# Patient Record
Sex: Female | Born: 1965 | Race: Black or African American | Hispanic: No | Marital: Single | Smoking: Never smoker
Health system: Southern US, Community
[De-identification: ages and names within clinical notes are randomized; demographics above are authoritative.]

## PROBLEM LIST (undated history)

## (undated) DIAGNOSIS — E119 Type 2 diabetes mellitus without complications: Secondary | ICD-10-CM

## (undated) DIAGNOSIS — F329 Major depressive disorder, single episode, unspecified: Secondary | ICD-10-CM

## (undated) DIAGNOSIS — B009 Herpesviral infection, unspecified: Secondary | ICD-10-CM

## (undated) DIAGNOSIS — F32A Depression, unspecified: Secondary | ICD-10-CM

## (undated) DIAGNOSIS — G4730 Sleep apnea, unspecified: Secondary | ICD-10-CM

## (undated) DIAGNOSIS — M62838 Other muscle spasm: Secondary | ICD-10-CM

## (undated) DIAGNOSIS — R51 Headache: Secondary | ICD-10-CM

## (undated) DIAGNOSIS — F1910 Other psychoactive substance abuse, uncomplicated: Secondary | ICD-10-CM

## (undated) DIAGNOSIS — N83209 Unspecified ovarian cyst, unspecified side: Secondary | ICD-10-CM

## (undated) HISTORY — PX: TUBAL LIGATION: SHX77

## (undated) HISTORY — DX: Herpesviral infection, unspecified: B00.9

---

## 2000-03-26 DIAGNOSIS — F1910 Other psychoactive substance abuse, uncomplicated: Secondary | ICD-10-CM

## 2000-03-26 HISTORY — DX: Other psychoactive substance abuse, uncomplicated: F19.10

## 2009-01-02 ENCOUNTER — Emergency Department (HOSPITAL_COMMUNITY): Admission: EM | Admit: 2009-01-02 | Discharge: 2009-01-02 | Payer: Self-pay | Admitting: Emergency Medicine

## 2009-07-13 ENCOUNTER — Emergency Department (HOSPITAL_COMMUNITY): Admission: EM | Admit: 2009-07-13 | Discharge: 2009-07-13 | Payer: Self-pay | Admitting: Emergency Medicine

## 2010-11-04 ENCOUNTER — Emergency Department (HOSPITAL_COMMUNITY): Payer: Medicare Other

## 2010-11-04 ENCOUNTER — Emergency Department (HOSPITAL_COMMUNITY)
Admission: EM | Admit: 2010-11-04 | Discharge: 2010-11-05 | Disposition: A | Discharge status: Home / Self Care | Payer: Medicare Other | Attending: Emergency Medicine | Admitting: Emergency Medicine

## 2010-11-04 DIAGNOSIS — R079 Chest pain, unspecified: Secondary | ICD-10-CM | POA: Insufficient documentation

## 2010-11-04 DIAGNOSIS — F329 Major depressive disorder, single episode, unspecified: Secondary | ICD-10-CM | POA: Insufficient documentation

## 2010-11-04 DIAGNOSIS — K219 Gastro-esophageal reflux disease without esophagitis: Secondary | ICD-10-CM | POA: Insufficient documentation

## 2010-11-04 DIAGNOSIS — M546 Pain in thoracic spine: Secondary | ICD-10-CM | POA: Insufficient documentation

## 2010-11-04 DIAGNOSIS — F3289 Other specified depressive episodes: Secondary | ICD-10-CM | POA: Insufficient documentation

## 2010-11-04 DIAGNOSIS — Z79899 Other long term (current) drug therapy: Secondary | ICD-10-CM | POA: Insufficient documentation

## 2010-11-04 LAB — DIFFERENTIAL
Basophils Absolute: 0 10*3/uL (ref 0.0–0.1)
Basophils Relative: 0 % (ref 0–1)
Eosinophils Absolute: 0.4 10*3/uL (ref 0.0–0.7)
Lymphocytes Relative: 34 % (ref 12–46)
Monocytes Absolute: 0.3 10*3/uL (ref 0.1–1.0)
Neutro Abs: 4.4 10*3/uL (ref 1.7–7.7)

## 2010-11-04 LAB — CBC
MCHC: 32.2 g/dL (ref 30.0–36.0)
Platelets: 217 10*3/uL (ref 150–400)
RBC: 3.78 MIL/uL — ABNORMAL LOW (ref 3.87–5.11)
RDW: 13.7 % (ref 11.5–15.5)
WBC: 7.8 10*3/uL (ref 4.0–10.5)

## 2010-11-04 LAB — BASIC METABOLIC PANEL
BUN: 11 mg/dL (ref 6–23)
Calcium: 8.7 mg/dL (ref 8.4–10.5)
Chloride: 101 mEq/L (ref 96–112)
Creatinine, Ser: 0.72 mg/dL (ref 0.50–1.10)
GFR calc Af Amer: >60 mL/min (ref >60)
GFR calc non Af Amer: >60 mL/min (ref >60)
Glucose, Bld: 87 mg/dL (ref 70–99)

## 2010-11-04 LAB — CK TOTAL AND CKMB (NOT AT ARMC)
CK, MB: 2.8 ng/mL (ref 0.3–4.0)
Relative Index: 1.5 (ref 0.0–2.5)
Total CK: 184 U/L — ABNORMAL HIGH (ref 7–177)

## 2010-11-04 MED ORDER — IOHEXOL 300 MG/ML  SOLN
100.0000 mL | Freq: Once | INTRAMUSCULAR | Status: AC | PRN
  Administered 2010-11-04: 100 mL via INTRAVENOUS

## 2010-11-29 ENCOUNTER — Emergency Department (HOSPITAL_COMMUNITY)
Admission: EM | Admit: 2010-11-29 | Discharge: 2010-11-29 | Disposition: A | Discharge status: Home / Self Care | Payer: Medicare Other | Attending: Emergency Medicine | Admitting: Emergency Medicine

## 2010-11-29 ENCOUNTER — Emergency Department (HOSPITAL_COMMUNITY): Payer: Medicare Other

## 2010-11-29 DIAGNOSIS — F329 Major depressive disorder, single episode, unspecified: Secondary | ICD-10-CM | POA: Insufficient documentation

## 2010-11-29 DIAGNOSIS — R0789 Other chest pain: Secondary | ICD-10-CM | POA: Insufficient documentation

## 2010-11-29 DIAGNOSIS — F3289 Other specified depressive episodes: Secondary | ICD-10-CM | POA: Insufficient documentation

## 2010-11-29 DIAGNOSIS — K219 Gastro-esophageal reflux disease without esophagitis: Secondary | ICD-10-CM | POA: Insufficient documentation

## 2010-11-29 DIAGNOSIS — R109 Unspecified abdominal pain: Secondary | ICD-10-CM | POA: Insufficient documentation

## 2010-11-29 DIAGNOSIS — R110 Nausea: Secondary | ICD-10-CM | POA: Insufficient documentation

## 2010-11-29 DIAGNOSIS — Z79899 Other long term (current) drug therapy: Secondary | ICD-10-CM | POA: Insufficient documentation

## 2010-11-29 LAB — COMPREHENSIVE METABOLIC PANEL
Albumin: 3.6 g/dL (ref 3.5–5.2)
BUN: 18 mg/dL (ref 6–23)
CO2: 28 mEq/L (ref 19–32)
Chloride: 101 mEq/L (ref 96–112)
Total Bilirubin: 0.7 mg/dL (ref 0.3–1.2)
Total Protein: 7.1 g/dL (ref 6.0–8.3)

## 2010-11-29 LAB — URINALYSIS, ROUTINE W REFLEX MICROSCOPIC
Glucose, UA: NEGATIVE mg/dL
Specific Gravity, Urine: 1.029 (ref 1.005–1.030)

## 2010-11-29 LAB — CBC
MCH: 29.2 pg (ref 26.0–34.0)
RDW: 13 % (ref 11.5–15.5)

## 2010-11-29 LAB — POCT I-STAT TROPONIN I: Troponin i, poc: 0 ng/mL (ref 0.00–0.08)

## 2010-11-29 LAB — POCT PREGNANCY, URINE: Preg Test, Ur: NEGATIVE

## 2010-11-29 LAB — LIPASE, BLOOD: Lipase: 20 U/L (ref 11–59)

## 2010-12-27 ENCOUNTER — Emergency Department (HOSPITAL_COMMUNITY): Payer: Medicare Other

## 2010-12-27 ENCOUNTER — Emergency Department (HOSPITAL_COMMUNITY)
Admission: EM | Admit: 2010-12-27 | Discharge: 2010-12-28 | Disposition: A | Discharge status: Home / Self Care | Payer: Medicare Other | Attending: Emergency Medicine | Admitting: Emergency Medicine

## 2010-12-27 DIAGNOSIS — K219 Gastro-esophageal reflux disease without esophagitis: Secondary | ICD-10-CM | POA: Insufficient documentation

## 2010-12-27 DIAGNOSIS — J45909 Unspecified asthma, uncomplicated: Secondary | ICD-10-CM | POA: Insufficient documentation

## 2010-12-27 DIAGNOSIS — M719 Bursopathy, unspecified: Secondary | ICD-10-CM | POA: Insufficient documentation

## 2010-12-27 DIAGNOSIS — G4730 Sleep apnea, unspecified: Secondary | ICD-10-CM | POA: Insufficient documentation

## 2010-12-27 DIAGNOSIS — M67919 Unspecified disorder of synovium and tendon, unspecified shoulder: Secondary | ICD-10-CM | POA: Insufficient documentation

## 2010-12-27 DIAGNOSIS — M25519 Pain in unspecified shoulder: Secondary | ICD-10-CM | POA: Insufficient documentation

## 2010-12-27 DIAGNOSIS — R0789 Other chest pain: Secondary | ICD-10-CM | POA: Insufficient documentation

## 2010-12-27 DIAGNOSIS — M79609 Pain in unspecified limb: Secondary | ICD-10-CM | POA: Insufficient documentation

## 2011-03-21 ENCOUNTER — Emergency Department (HOSPITAL_COMMUNITY)
Admission: EM | Admit: 2011-03-21 | Discharge: 2011-03-22 | Disposition: A | Discharge status: Home / Self Care | Payer: Medicare Other | Attending: Emergency Medicine | Admitting: Emergency Medicine

## 2011-03-21 ENCOUNTER — Encounter: Payer: Self-pay | Admitting: Emergency Medicine

## 2011-03-21 DIAGNOSIS — G4730 Sleep apnea, unspecified: Secondary | ICD-10-CM | POA: Insufficient documentation

## 2011-03-21 DIAGNOSIS — B86 Scabies: Secondary | ICD-10-CM | POA: Insufficient documentation

## 2011-03-21 DIAGNOSIS — L299 Pruritus, unspecified: Secondary | ICD-10-CM | POA: Insufficient documentation

## 2011-03-21 DIAGNOSIS — R21 Rash and other nonspecific skin eruption: Secondary | ICD-10-CM | POA: Insufficient documentation

## 2011-03-21 DIAGNOSIS — J45909 Unspecified asthma, uncomplicated: Secondary | ICD-10-CM | POA: Insufficient documentation

## 2011-03-21 HISTORY — DX: Sleep apnea, unspecified: G47.30

## 2011-03-21 NOTE — ED Notes (Signed)
Pt. Reports generalized itchy body rashes for 5 days .

## 2011-03-22 MED ORDER — DIPHENHYDRAMINE HCL 50 MG/ML IJ SOLN
50.0000 mg | Freq: Once | INTRAMUSCULAR | Status: AC
  Administered 2011-03-22: 50 mg via INTRAVENOUS
  Filled 2011-03-22: qty 1

## 2011-03-22 MED ORDER — PERMETHRIN 5 % EX CREA: TOPICAL_CREAM | CUTANEOUS | Status: AC

## 2011-03-22 NOTE — ED Provider Notes (Signed)
History     CSN: [id number]  Arrival date & time 03/21/11  2257   None     No chief complaint on file.   ) Patient is a 45 y.o. female presenting with rash.  Rash  This is a new problem. The current episode started more than 2 days ago. The problem has been gradually worsening. The problem is associated with nothing. There has been no fever. The rash is present on the torso, back, right lower leg, right upper leg, left lower leg, left upper leg, left arm, right arm, right hand and left hand. The pain is at a severity of 0/10. The patient is experiencing no pain. Associated symptoms include itching. Pertinent negatives include no blisters and no weeping. She has tried nothing for the symptoms.  Reports onset of itchy rash approximately one week ago. Denies exposure to anyone with rash.   Past Medical History  Diagnosis Date  . Asthma   . Sleep apnea     History reviewed. No pertinent past surgical history.  No family history on file.  History  Substance Use Topics  . Smoking status: Never Smoker   . Smokeless tobacco: Not on file  . Alcohol Use: No    OB History    Grav Para Term Preterm Abortions TAB SAB Ect Mult Living                  Review of Systems  Constitutional: Negative.   HENT: Negative.   Eyes: Negative.   Respiratory: Negative.   Cardiovascular: Negative.   Gastrointestinal: Negative.   Genitourinary: Negative.   Musculoskeletal: Negative.   Skin: Positive for itching and rash.  Neurological: Negative.   Hematological: Negative.   Psychiatric/Behavioral: Negative.     Allergies  Review of patient's allergies indicates no known allergies.  Home Medications  No current outpatient prescriptions on file.  BP 148/76  Pulse 90  Temp(Src) 97.8 F (36.6 C) (Oral)  Resp 16  SpO2 97%  LMP 02/22/2011  Physical Exam  Constitutional: She is oriented to person, place, and time. She appears well-developed and well-nourished.  HENT:  Head:  Normocephalic and atraumatic.  Eyes: Conjunctivae are normal.  Neck: Neck supple.  Cardiovascular: Normal rate.   Pulmonary/Chest: Effort normal.  Musculoskeletal: Normal range of motion.  Neurological: She is alert and oriented to person, place, and time.  Skin: Skin is warm and dry. No erythema.       Raised erythematous lesions noted to back, BUE's , abd and BLE's.   Psychiatric: She has a normal mood and affect.    ED Course  Procedures findings and impression discussed with patient. Will discharge home with treatment for scabies.  Labs Reviewed - No data to display No results found.   No diagnosis found.    MDM  HPI/PE c/w scabies        Leanne Chang, NP 03/22/11 [phone]

## 2011-03-22 NOTE — ED Provider Notes (Signed)
Medical screening examination/treatment/procedure(s) were performed by non-physician practitioner and as supervising physician I was immediately available for consultation/collaboration.   Hanley Seamen, MD 03/22/11 [phone]

## 2011-04-09 ENCOUNTER — Inpatient Hospital Stay (HOSPITAL_COMMUNITY)
Admission: AD | Admit: 2011-04-09 | Discharge: 2011-04-09 | Disposition: A | Discharge status: Home / Self Care | Payer: Medicare Other | Source: Ambulatory Visit | Attending: Obstetrics & Gynecology | Admitting: Obstetrics & Gynecology

## 2011-04-09 ENCOUNTER — Inpatient Hospital Stay (HOSPITAL_COMMUNITY): Payer: Medicare Other

## 2011-04-09 ENCOUNTER — Encounter (HOSPITAL_COMMUNITY): Payer: Self-pay | Admitting: *Deleted

## 2011-04-09 DIAGNOSIS — N920 Excessive and frequent menstruation with regular cycle: Secondary | ICD-10-CM | POA: Insufficient documentation

## 2011-04-09 HISTORY — DX: Depression, unspecified: F32.A

## 2011-04-09 HISTORY — DX: Major depressive disorder, single episode, unspecified: F32.9

## 2011-04-09 HISTORY — DX: Headache: R51

## 2011-04-09 HISTORY — DX: Other psychoactive substance abuse, uncomplicated: F19.10

## 2011-04-09 LAB — CBC
HCT: 31.5 % — ABNORMAL LOW (ref 36.0–46.0)
Hemoglobin: 10.1 g/dL — ABNORMAL LOW (ref 12.0–15.0)
MCHC: 32.1 g/dL (ref 30.0–36.0)
WBC: 7.9 10*3/uL (ref 4.0–10.5)

## 2011-04-09 LAB — URINALYSIS, ROUTINE W REFLEX MICROSCOPIC
Bilirubin Urine: NEGATIVE
Ketones, ur: NEGATIVE mg/dL
Specific Gravity, Urine: 1.015 (ref 1.005–1.030)
Urobilinogen, UA: 0.2 mg/dL (ref 0.0–1.0)
pH: 5.5 (ref 5.0–8.0)

## 2011-04-09 LAB — URINE MICROSCOPIC-ADD ON

## 2011-04-09 LAB — POCT PREGNANCY, URINE: Preg Test, Ur: NEGATIVE

## 2011-04-09 LAB — WET PREP, GENITAL
Clue Cells Wet Prep HPF POC: NONE SEEN
Yeast Wet Prep HPF POC: NONE SEEN

## 2011-04-09 MED ORDER — MEDROXYPROGESTERONE ACETATE 10 MG PO TABS: 10.0000 mg | ORAL_TABLET | Freq: Every day | ORAL | Status: DC

## 2011-04-09 NOTE — Progress Notes (Addendum)
Bleeding started on THurs, been heavy - gushing and clots. Feeling weak.  Tubes tied 2002, at times just since then just spots with period, never had heavy bleeding like this. No pain , just feel weak.

## 2011-04-09 NOTE — ED Provider Notes (Signed)
History   Patient is a 46 year old [id number] who presents with chief complaint of heavy vaginal bleeding and clots for 5 days.  Prior to the bleeding she reports that she had been spotting, but reports a constant, heavy blood flow for the past 5 days.  She reports headaches and fatigue for the past 5 days.  Patient denies fever, chills, chest pain, shortness of breath, leg cramps, abdominal pain, dysuria, new sexual partners, and vaginal discharge.  She states her last pap smear was in 2012 and is not aware of any previous abnormal pap smears.  She denies smoking and alcohol use.    Chief Complaint  Patient presents with  . Menorrhagia   HPI   Pertinent Gynecological History: Menses: irregular occurring approximately every 60 days with spotting approximately 4-5 days per month Bleeding: intermenstrual bleeding Contraception: tubal ligation DES exposure: unknown Blood transfusions: none Sexually transmitted diseases: past history: HSV Previous GYN Procedures: N/A  Last pap: normal Date: 2012   Past Medical History  Diagnosis Date  . Asthma   . Sleep apnea   . Headache   . Drug abuse 2002  . Depression     on meds    Past Surgical History  Procedure Date  . Cesarean section   . Tubal ligation     Family History  Problem Relation Age of Onset  . Anesthesia problems Neg Hx   . Diabetes Mother   . Alcohol abuse Father   . Liver disease Father     History  Substance Use Topics  . Smoking status: Never Smoker   . Smokeless tobacco: Never Used  . Alcohol Use: No    Allergies: No Known Allergies  Prescriptions prior to admission  Medication Sig Dispense Refill  . citalopram (CELEXA) 20 MG tablet Take 20 mg by mouth daily.      . citalopram (CELEXA) 40 MG tablet Take 40 mg by mouth daily.      . IRON PO Take 1 tablet by mouth daily.      . traZODone (DESYREL) 150 MG tablet Take 150 mg by mouth at bedtime.      . valACYclovir (VALTREX) 1000 MG tablet Take 1,000 mg by  mouth daily.        Review of Systems  Constitutional: Positive for malaise/fatigue. Negative for fever and chills.  Eyes: Negative for blurred vision.  Respiratory: Negative for shortness of breath.   Cardiovascular: Negative for chest pain and leg swelling.  Gastrointestinal: Negative for nausea, vomiting, abdominal pain, diarrhea and blood in stool.  Genitourinary: Positive for hematuria. Negative for dysuria.  Neurological: Positive for headaches. Negative for loss of consciousness.   Physical Exam   Blood pressure 124/75, pulse 65, temperature 98.6 F (37 C), temperature source Oral, resp. rate 20, height 5\' 1"  (1.549 m), weight 100.699 kg (222 lb), last menstrual period 04/05/2011, SpO2 99.00%.  Physical Exam  Constitutional: She is oriented to person, place, and time. She appears well-developed and well-nourished.  HENT:  Head: Normocephalic.  Cardiovascular: Normal rate, regular rhythm and normal heart sounds.  Exam reveals no gallop and no friction rub.   No murmur heard. Respiratory: Effort normal and breath sounds normal. No respiratory distress. She has no wheezes. She has no rales.  GI: Soft. Bowel sounds are normal. There is tenderness (left lower quadrant).  Neurological: She is alert and oriented to person, place, and time.  Skin: Skin is warm and dry.  Psychiatric: She has a normal mood and affect.  MAU Course  Procedures  CBC Urine HCG Transvaginal ultrasound   Assessment and Plan   I sign out this patient to Sharen Counter, CNM, at 5:00pm, 04/09/2011. [facility] PA student  LEFTWICH-KIRBY, LISA 04/09/2011, 5:32 PM

## 2011-04-09 NOTE — Progress Notes (Signed)
Pt states menses began last Thursday, has had many blood clots, changing pad q1hour, starting to feel tired, feels weak also. Never had any issues with heavy bleeding before. No vaginal d/c change besides blood.

## 2011-04-10 LAB — GC/CHLAMYDIA PROBE AMP, GENITAL
Chlamydia, DNA Probe: NEGATIVE
GC Probe Amp, Genital: NEGATIVE

## 2011-04-17 ENCOUNTER — Encounter (HOSPITAL_COMMUNITY): Payer: Self-pay | Admitting: *Deleted

## 2011-04-17 ENCOUNTER — Inpatient Hospital Stay (HOSPITAL_COMMUNITY)
Admission: AD | Admit: 2011-04-17 | Discharge: 2011-04-17 | Disposition: A | Discharge status: Home / Self Care | Payer: Medicare Other | Source: Ambulatory Visit | Attending: Obstetrics & Gynecology | Admitting: Obstetrics & Gynecology

## 2011-04-17 DIAGNOSIS — N938 Other specified abnormal uterine and vaginal bleeding: Secondary | ICD-10-CM | POA: Insufficient documentation

## 2011-04-17 DIAGNOSIS — N949 Unspecified condition associated with female genital organs and menstrual cycle: Secondary | ICD-10-CM | POA: Insufficient documentation

## 2011-04-17 DIAGNOSIS — N921 Excessive and frequent menstruation with irregular cycle: Secondary | ICD-10-CM

## 2011-04-17 DIAGNOSIS — N920 Excessive and frequent menstruation with regular cycle: Secondary | ICD-10-CM

## 2011-04-17 LAB — POCT PREGNANCY, URINE: Preg Test, Ur: NEGATIVE

## 2011-04-17 LAB — URINALYSIS, ROUTINE W REFLEX MICROSCOPIC
Leukocytes, UA: NEGATIVE
Protein, ur: NEGATIVE mg/dL
Specific Gravity, Urine: 1.025 (ref 1.005–1.030)
Urobilinogen, UA: 0.2 mg/dL (ref 0.0–1.0)

## 2011-04-17 LAB — CBC
MCH: 28.9 pg (ref 26.0–34.0)
MCHC: 31.5 g/dL (ref 30.0–36.0)
MCV: 91.8 fL (ref 78.0–100.0)
Platelets: 218 10*3/uL (ref 150–400)
RDW: 15 % (ref 11.5–15.5)

## 2011-04-17 MED ORDER — MEDROXYPROGESTERONE ACETATE 150 MG/ML IM SUSP
150.0000 mg | INTRAMUSCULAR | Status: AC
  Administered 2011-04-17: 150 mg via INTRAMUSCULAR
  Filled 2011-04-17: qty 1

## 2011-04-17 NOTE — ED Provider Notes (Signed)
History   Patient is a 46 year old G5P5005 who presents with chief complaint of heavy vaginal bleeding and clots for 2 weeks.  She reports fatigue associated with onset of bleeding. She was treated in MAU for these symptoms 1 week ago and reports completing medications prescribed.  Chief Complaint  Patient presents with  . Vaginal Bleeding   Vaginal Bleeding Associated symptoms include hematuria. Pertinent negatives include no abdominal pain, chills, diarrhea, dysuria, fever, nausea or vomiting.    Pertinent Gynecological History: Menses: flow is excessive with use of 10+ pads or tampons on heaviest days Bleeding: intermenstrual bleeding Contraception: tubal ligation DES exposure: unknown Blood transfusions: none Sexually transmitted diseases: past history: HSV Previous GYN Procedures: None  Last mammogram: unknown  Last pap: normal Date: 2012   Past Medical History  Diagnosis Date  . Asthma   . Sleep apnea   . Headache   . Drug abuse 2002  . Depression     on meds    Past Surgical History  Procedure Date  . Cesarean section   . Tubal ligation     Family History  Problem Relation Age of Onset  . Anesthesia problems Neg Hx   . Diabetes Mother   . Alcohol abuse Father   . Liver disease Father     History  Substance Use Topics  . Smoking status: Never Smoker   . Smokeless tobacco: Never Used  . Alcohol Use: No    Allergies: No Known Allergies  Prescriptions prior to admission  Medication Sig Dispense Refill  . citalopram (CELEXA) 20 MG tablet Take 20 mg by mouth daily.      . citalopram (CELEXA) 40 MG tablet Take 40 mg by mouth daily.      . IRON PO Take 1 tablet by mouth 2 (two) times daily.       . traZODone (DESYREL) 150 MG tablet Take 150 mg by mouth at bedtime.      . valACYclovir (VALTREX) 1000 MG tablet Take 1,000 mg by mouth daily.      Marland Kitchen DISCONTD: medroxyPROGESTERone (PROVERA) 10 MG tablet Take 1 tablet (10 mg total) by mouth daily.  10 tablet  0     Review of Systems  Constitutional: Positive for malaise/fatigue. Negative for fever and chills.  Eyes: Negative for blurred vision.  Respiratory: Negative for shortness of breath.   Cardiovascular: Negative for chest pain and leg swelling.  Gastrointestinal: Negative for nausea, vomiting, abdominal pain, diarrhea and blood in stool.  Genitourinary: Positive for hematuria and vaginal bleeding. Negative for dysuria.  Neurological: Negative for loss of consciousness.  She denies smoking and alcohol use.    Physical Exam   Blood pressure 106/58, pulse 81, temperature 99.2 F (37.3 C), temperature source Oral, resp. rate 20, height 5' 0.75" (1.543 m), weight 99.338 kg (219 lb), last menstrual period 04/05/2011.  Physical Exam  Constitutional: She is oriented to person, place, and time. She appears well-developed and well-nourished.  HENT:  Head: Normocephalic.  Cardiovascular: Normal rate, regular rhythm and normal heart sounds.  Exam reveals no gallop and no friction rub.   No murmur heard. Respiratory: Effort normal and breath sounds normal. No respiratory distress. She has no wheezes. She has no rales.  GI: Soft. Bowel sounds are normal. There is tenderness (left lower quadrant).  Neurological: She is alert and oriented to person, place, and time.  Skin: Skin is warm and dry.  Psychiatric: She has a normal mood and affect.   Results for orders  placed during the hospital encounter of 04/17/11 (from the past 24 hour(s))  URINALYSIS, ROUTINE W REFLEX MICROSCOPIC     Status: Abnormal   Collection Time   04/17/11  1:48 PM      Component Value Range   Color, Urine YELLOW  YELLOW    APPearance HAZY (*) CLEAR    Specific Gravity, Urine 1.025  1.005 - 1.030    pH 6.0  5.0 - 8.0    Glucose, UA NEGATIVE  NEGATIVE (mg/dL)   Hgb urine dipstick LARGE (*) NEGATIVE    Bilirubin Urine NEGATIVE  NEGATIVE    Ketones, ur NEGATIVE  NEGATIVE (mg/dL)   Protein, ur NEGATIVE  NEGATIVE (mg/dL)    Urobilinogen, UA 0.2  0.0 - 1.0 (mg/dL)   Nitrite NEGATIVE  NEGATIVE    Leukocytes, UA NEGATIVE  NEGATIVE   URINE MICROSCOPIC-ADD ON     Status: Normal   Collection Time   04/17/11  1:48 PM      Component Value Range   Squamous Epithelial / LPF RARE  RARE    RBC / HPF TOO NUMEROUS TO COUNT  <3 (RBC/hpf)  POCT PREGNANCY, URINE     Status: Normal   Collection Time   04/17/11  1:59 PM      Component Value Range   Preg Test, Ur NEGATIVE    CBC     Status: Abnormal   Collection Time   04/17/11  3:26 PM      Component Value Range   WBC 7.8  4.0 - 10.5 (K/uL)   RBC 3.29 (*) 3.87 - 5.11 (MIL/uL)   Hemoglobin 9.5 (*) 12.0 - 15.0 (g/dL)   HCT 16.1 (*) 09.6 - 46.0 (%)   MCV 91.8  78.0 - 100.0 (fL)   MCH 28.9  26.0 - 34.0 (pg)   MCHC 31.5  30.0 - 36.0 (g/dL)   RDW 04.5  40.9 - 81.1 (%)   Platelets 218  150 - 400 (K/uL)    MAU Course  Procedures  CBC Depo Provera x1 dose in MAU  Discussed POC  And reviewed labs with Nicholaus Bloom, MD, and she agrees with plan for Depo Provera and D/C with planned f/u.  Assessment and Plan  A: Abnormal uterine bleeding  P: D/C home Pt has scheduled appointment in GYN clinic February 27.   Return to MAU if bleeding greater than 1 pad/hour.   LEFTWICH-KIRBY, Bobbe Quilter 04/17/2011, 6:31 PM

## 2011-04-17 NOTE — Progress Notes (Signed)
Pt states, " I was here in December for bleeding, and I have an appt at the clinic on February 27th. They gave me some pills and my bleeding slowed down but didn't stop. The bleeding has gotten worse in the past week and I've been passing clots."

## 2011-05-23 ENCOUNTER — Ambulatory Visit (INDEPENDENT_AMBULATORY_CARE_PROVIDER_SITE_OTHER): Payer: Medicare Other | Admitting: Physician Assistant

## 2011-05-23 ENCOUNTER — Encounter: Payer: Self-pay | Admitting: Physician Assistant

## 2011-05-23 ENCOUNTER — Other Ambulatory Visit (HOSPITAL_COMMUNITY)
Admission: RE | Admit: 2011-05-23 | Discharge: 2011-05-23 | Disposition: A | Discharge status: Home / Self Care | Payer: Medicare Other | Source: Ambulatory Visit | Attending: Obstetrics & Gynecology | Admitting: Obstetrics & Gynecology

## 2011-05-23 DIAGNOSIS — N920 Excessive and frequent menstruation with regular cycle: Secondary | ICD-10-CM | POA: Insufficient documentation

## 2011-05-23 DIAGNOSIS — Z01812 Encounter for preprocedural laboratory examination: Secondary | ICD-10-CM

## 2011-05-23 NOTE — Progress Notes (Signed)
Chief Complaint:  Menorrhagia   Tiffany Blake is  46 y.o. [id number].  Patient's last menstrual period was 03/07/2011.Marland Kitchen  Her pregnancy status is negative.  She presents complaining of Menorrhagia  Pt presents for an endometrial biopsy secondary to menorrhagia and pelvic US showing thickened endometrium in MAU. Pt reports no previous history of heavy bleeding prior to MAU visit. States she was given a "shot to stop bleeding" while in MAU.   Obstetrical/Gynecological History: OB History    Grav Para Term Preterm Abortions TAB SAB Ect Mult Living   5 5 5  0 0 0 0 0 0 5      Past Medical History: Past Medical History  Diagnosis Date  . Asthma   . Sleep apnea   . Headache   . Depression     on meds  . Drug abuse 2002  . HSV (herpes simplex virus) infection     Past Surgical History: Past Surgical History  Procedure Date  . Cesarean section   . Tubal ligation     Family History: Family History  Problem Relation Age of Onset  . Anesthesia problems Neg Hx   . Diabetes Mother   . Alcohol abuse Father   . Liver disease Father     Social History: History  Substance Use Topics  . Smoking status: Never Smoker   . Smokeless tobacco: Never Used  . Alcohol Use: No    Allergies: No Known Allergies  Review of Systems - Negative except what has been reviewed in the MAU.  Physical Exam   Blood pressure 140/89, pulse 87, temperature 99 F (37.2 C), temperature source Oral, last menstrual period 03/07/2011.  General: General appearance - alert, well appearing, and in no distress, oriented to person, place, and time and overweight Mental status - alert, oriented to person, place, and time, questionable mental delay Abdomen - soft, nontender, nondistended, no masses or organomegaly obese Neurological - alert, oriented, normal speech, no focal findings or movement disorder noted, screening mental status exam normal Focused Gynecological Exam: Patient given informed consent, signed  copy in the chart, time out was performed. Appropriate time out taken. . The patient was placed in the lithotomy position and the cervix brought into view with sterile speculum.  Portio of cervix cleansed x 2 with betadine swabs.  A tenaculum was placed in the posterior lip of the cervix.  The uterus was sounded for depth of 10cm. A pipelle was introduced to into the uterus, suction created,  and an endometrial sample was obtained. All equipment was removed and accounted for.  The patient tolerated the procedure well.    Labs:  04/09/11: Hgb 9.5  Imaging Studies:  RADIOLOGY REPORT*  Clinical Data: Menorrhagia  TRANSABDOMINAL AND TRANSVAGINAL ULTRASOUND OF PELVIS  Technique: Both transabdominal and transvaginal ultrasound  examinations of the pelvis were performed. Transabdominal technique  was performed for global imaging of the pelvis including uterus,  ovaries, adnexal regions, and pelvic cul-de-sac.  Comparison: None.  It was necessary to proceed with endovaginal exam following the  transabdominal exam to visualize the endometrium and left ovary.  Findings:  Uterus: Anteverted, mildly retroflexed, which renders visualization  of the fundus somewhat suboptimal. Overall measurements 11.4 x 5.3  x 5.1 cm. The fundal intramural / subserosal fibroid versus focal  adenomyosis measures 4.5 x 5.4 x 2.7 cm. No appreciable mass  effect upon the endometrium is identified.  Endometrium: 1.7 cm. Uniformly echogenic.  Right ovary: 3.3 x 2.2 x 1.8 cm. Normal. Seen transabdominally  only.  Left ovary: 4.6 x 3.9 x 2.5 cm. In the left adnexa, there is a 3.5  x 3.3 x 2.6 cm cystic structure with suggestion of either a single  thin internal septation or possibly partial mural infolding. This  could represent an ovarian cyst, less likely hydrosalpinx.  Other findings: Small free fluid noted in the cul-de-sac.  IMPRESSION:  Left ovarian cyst or less likely a small hydrosalpinx in the left  adnexa.  Follow-up pelvic ultrasound is recommended in 6-8 weeks.  Mild prominence of the endometrium without focal thickening.  Consider re-imaging during the week following the patient's menses  for improved visualization of the endometrium and better evaluation  of possible underlying lesion which may be obscured today.    Assessment: 1. Pre-procedure lab exam    2. Menorrhagia  US Pelvis Complete, Surgical pathology, US Transvaginal Non-OB      Plan: RTC in 4 weeks for results Plan to continue Depo Provera  Illona Bulman E. 05/23/2011,2:08 PM

## 2011-05-23 NOTE — Patient Instructions (Signed)
Endometrial Biopsy This is a test in which a tissue sample (a biopsy) is taken from inside the uterus (womb). It is then looked at by a specialist under a microscope to see if the tissue is normal or abnormal. The endometrium is the lining of the uterus. This test helps determine where you are in your menstrual cycle and how hormone levels are affecting the lining of the uterus. Another use for this test is to diagnose endometrial cancer, tuberculosis, polyps, or inflammatory conditions and to evaluate uterine bleeding. PREPARATION FOR TEST No preparation or fasting is necessary. NORMAL FINDINGS No pathologic conditions. Presence of "secretory-type" endometrium 3 to 5 days before to normal menstruation. Ranges for normal findings may vary among different laboratories and hospitals. You should always check with your doctor after having lab work or other tests done to discuss the meaning of your test results and whether your values are considered within normal limits. MEANING OF TEST  Your caregiver will go over the test results with you and discuss the importance and meaning of your results, as well as treatment options and the need for additional tests if necessary. OBTAINING THE TEST RESULTS It is your responsibility to obtain your test results. Ask the lab or department performing the test when and how you will get your results. Document Released: 07/13/2004 Document Revised: 11/22/2010 Document Reviewed: 02/20/2008 [employer] Patient Information 2012 [employer]. 

## 2011-05-30 ENCOUNTER — Ambulatory Visit (HOSPITAL_COMMUNITY)
Admission: RE | Admit: 2011-05-30 | Discharge: 2011-05-30 | Disposition: A | Discharge status: Home / Self Care | Payer: Medicare Other | Source: Ambulatory Visit | Attending: Physician Assistant | Admitting: Physician Assistant

## 2011-05-30 DIAGNOSIS — N920 Excessive and frequent menstruation with regular cycle: Secondary | ICD-10-CM | POA: Insufficient documentation

## 2011-05-30 DIAGNOSIS — N83209 Unspecified ovarian cyst, unspecified side: Secondary | ICD-10-CM | POA: Insufficient documentation

## 2011-05-31 ENCOUNTER — Telehealth: Payer: Self-pay | Admitting: *Deleted

## 2011-05-31 NOTE — Telephone Encounter (Signed)
Pt called stating has blood in urine. No further information was given.

## 2011-05-31 NOTE — Telephone Encounter (Signed)
Called patient and she complains of blood in urine, and urinary frequency/urgency, x few weeks but denies pain or burning with urination.  Patient states not sure when she had her last period, but spots at times. Per chart review patient seen recently for menorrhagia but she states she forgot to tell provider about the blood in urine and urgency/frequency.  Discussed with patient blood in urine may be from her spotting or may be UTI- instructed patient to come in tomorrow between 8a and 11a to give urine specimen to check for uti. Informed patient she will not see provider tomorrow - if urine is negative will need to make appointment to see provider. Patient voices understanding of plan of care.

## 2011-06-01 ENCOUNTER — Other Ambulatory Visit (INDEPENDENT_AMBULATORY_CARE_PROVIDER_SITE_OTHER): Payer: Medicare Other

## 2011-06-01 DIAGNOSIS — R319 Hematuria, unspecified: Secondary | ICD-10-CM

## 2011-06-01 LAB — POCT URINALYSIS DIP (DEVICE)
Leukocytes, UA: NEGATIVE
Nitrite: NEGATIVE
Protein, ur: 30 mg/dL — AB
Urobilinogen, UA: 0.2 mg/dL (ref 0.0–1.0)
pH: 7 (ref 5.0–8.0)

## 2011-06-03 LAB — URINE CULTURE

## 2011-06-06 ENCOUNTER — Encounter (HOSPITAL_COMMUNITY): Payer: Self-pay | Admitting: *Deleted

## 2011-06-06 ENCOUNTER — Inpatient Hospital Stay (HOSPITAL_COMMUNITY)
Admission: AD | Admit: 2011-06-06 | Discharge: 2011-06-06 | Disposition: A | Discharge status: Home / Self Care | Payer: Medicare Other | Source: Ambulatory Visit | Attending: Obstetrics & Gynecology | Admitting: Obstetrics & Gynecology

## 2011-06-06 DIAGNOSIS — N949 Unspecified condition associated with female genital organs and menstrual cycle: Secondary | ICD-10-CM | POA: Insufficient documentation

## 2011-06-06 DIAGNOSIS — N730 Acute parametritis and pelvic cellulitis: Secondary | ICD-10-CM

## 2011-06-06 DIAGNOSIS — A5901 Trichomonal vulvovaginitis: Secondary | ICD-10-CM | POA: Insufficient documentation

## 2011-06-06 DIAGNOSIS — N739 Female pelvic inflammatory disease, unspecified: Secondary | ICD-10-CM | POA: Insufficient documentation

## 2011-06-06 DIAGNOSIS — N920 Excessive and frequent menstruation with regular cycle: Secondary | ICD-10-CM | POA: Insufficient documentation

## 2011-06-06 HISTORY — DX: Unspecified ovarian cyst, unspecified side: N83.209

## 2011-06-06 LAB — URINALYSIS, ROUTINE W REFLEX MICROSCOPIC
Bilirubin Urine: NEGATIVE
Glucose, UA: NEGATIVE mg/dL
Nitrite: NEGATIVE
Specific Gravity, Urine: 1.025 (ref 1.005–1.030)
pH: 6 (ref 5.0–8.0)

## 2011-06-06 LAB — DIFFERENTIAL
Basophils Relative: 0 % (ref 0–1)
Eosinophils Absolute: 0.2 10*3/uL (ref 0.0–0.7)
Eosinophils Relative: 2 % (ref 0–5)
Lymphs Abs: 2 10*3/uL (ref 0.7–4.0)

## 2011-06-06 LAB — CBC
MCH: 29.8 pg (ref 26.0–34.0)
MCHC: 31.3 g/dL (ref 30.0–36.0)
MCV: 95.2 fL (ref 78.0–100.0)
Platelets: 177 10*3/uL (ref 150–400)
RDW: 14.9 % (ref 11.5–15.5)

## 2011-06-06 LAB — URINE MICROSCOPIC-ADD ON

## 2011-06-06 MED ORDER — KETOROLAC TROMETHAMINE 60 MG/2ML IM SOLN
60.0000 mg | Freq: Once | INTRAMUSCULAR | Status: AC
  Administered 2011-06-06: 60 mg via INTRAMUSCULAR
  Filled 2011-06-06: qty 2

## 2011-06-06 MED ORDER — IBUPROFEN 200 MG PO TABS: 600.0000 mg | ORAL_TABLET | Freq: Four times a day (QID) | ORAL | Status: AC | PRN

## 2011-06-06 MED ORDER — AZITHROMYCIN 250 MG PO TABS
1000.0000 mg | ORAL_TABLET | Freq: Once | ORAL | Status: AC
  Administered 2011-06-06: 1000 mg via ORAL
  Filled 2011-06-06 (×2): qty 4

## 2011-06-06 MED ORDER — METRONIDAZOLE 500 MG PO TABS
500.0000 mg | ORAL_TABLET | Freq: Once | ORAL | Status: AC
  Administered 2011-06-06: 500 mg via ORAL
  Filled 2011-06-06: qty 1

## 2011-06-06 MED ORDER — METRONIDAZOLE 500 MG PO TABS: 500.0000 mg | ORAL_TABLET | Freq: Three times a day (TID) | ORAL | Status: AC

## 2011-06-06 MED ORDER — CEFTRIAXONE SODIUM 250 MG IJ SOLR
250.0000 mg | Freq: Once | INTRAMUSCULAR | Status: AC
  Administered 2011-06-06: 250 mg via INTRAMUSCULAR
  Filled 2011-06-06: qty 250

## 2011-06-06 NOTE — [facility] Provider Note (Signed)
History     CSN: [id number]  Arrival date and time: 06/06/11 1204   First Provider Initiated Contact with Patient 06/06/11 1308      Chief Complaint  Patient presents with  . Abdominal Pain   HPI This is a 46 y.o. who presents with c/o bleeding and passage of tissue. Has not been soaking through pads.  Also c/o severe pelvic pain, worsened yesterday. Has been evaluated recently for menorrhagia and endometrial biopsy revealed inactive endometrium. No fever, N/V/D or constipation.   OB History    Grav Para Term Preterm Abortions TAB SAB Ect Mult Living   5 5 5  0 0 0 0 0 0 5      Past Medical History  Diagnosis Date  . Asthma   . Sleep apnea   . Headache   . Depression     on meds  . Drug abuse 2002  . HSV (herpes simplex virus) infection   . Ovarian cyst     Past Surgical History  Procedure Date  . Cesarean section   . Tubal ligation     Family History  Problem Relation Age of Onset  . Anesthesia problems Neg Hx   . Diabetes Mother   . Alcohol abuse Father   . Liver disease Father     History  Substance Use Topics  . Smoking status: Never Smoker   . Smokeless tobacco: Never Used  . Alcohol Use: No    Allergies: No Known Allergies  Prescriptions prior to admission  Medication Sig Dispense Refill  . citalopram (CELEXA) 20 MG tablet Take 20 mg by mouth daily.      . citalopram (CELEXA) 40 MG tablet Take 40 mg by mouth daily.      . IRON PO Take 1 tablet by mouth 2 (two) times daily.       . traZODone (DESYREL) 150 MG tablet Take 150 mg by mouth at bedtime.      . valACYclovir (VALTREX) 1000 MG tablet Take 1,000 mg by mouth daily.        ROS As above  Physical Exam   Blood pressure 148/86, pulse 85, temperature 98.1 F (36.7 C), temperature source Oral, resp. rate 20, height 5\' 1"  (1.549 m), weight 235 lb 9.6 oz (106.867 kg), last menstrual period 03/07/2011, SpO2 97.00%.  Physical Exam  Constitutional: She is oriented to person, place, and time.  She appears well-developed and well-nourished. No distress.       Grimacing in pain at times  HENT:  Head: Normocephalic.  Cardiovascular: Normal rate.   Respiratory: Effort normal.  GI: Soft. She exhibits no distension and no mass. There is tenderness (diffuse, but more concentrated over pelvis). There is no rebound and no guarding.  Genitourinary: Uterus normal. Vaginal discharge (Small amt red blood with small plug of clear/red tissue removed from os) found.  Musculoskeletal: Normal range of motion.  Neurological: She is alert and oriented to person, place, and time.  Skin: Skin is warm and dry.  Psychiatric: She has a normal mood and affect.       Difficult historian at times, does not always understand everything I say to her  Cervical motion tenderness noted  Results for orders placed during the hospital encounter of 06/06/11 (from the past 24 hour(s))  URINALYSIS, ROUTINE W REFLEX MICROSCOPIC     Status: Abnormal   Collection Time   06/06/11 12:35 PM      Component Value Range   Color, Urine YELLOW  YELLOW  APPearance HAZY (*) CLEAR    Specific Gravity, Urine 1.025  1.005 - 1.030    pH 6.0  5.0 - 8.0    Glucose, UA NEGATIVE  NEGATIVE (mg/dL)   Hgb urine dipstick LARGE (*) NEGATIVE    Bilirubin Urine NEGATIVE  NEGATIVE    Ketones, ur NEGATIVE  NEGATIVE (mg/dL)   Protein, ur NEGATIVE  NEGATIVE (mg/dL)   Urobilinogen, UA 0.2  0.0 - 1.0 (mg/dL)   Nitrite NEGATIVE  NEGATIVE    Leukocytes, UA NEGATIVE  NEGATIVE   URINE MICROSCOPIC-ADD ON     Status: Abnormal   Collection Time   06/06/11 12:35 PM      Component Value Range   Squamous Epithelial / LPF FEW (*) RARE    WBC, UA 7-10  <3 (WBC/hpf)   RBC / HPF TOO NUMEROUS TO COUNT  <3 (RBC/hpf)   Bacteria, UA FEW (*) RARE    Urine-Other TRICHOMONAS PRESENT    CBC     Status: Abnormal   Collection Time   06/06/11  1:30 PM      Component Value Range   WBC 7.0  4.0 - 10.5 (K/uL)   RBC 3.96  3.87 - 5.11 (MIL/uL)   Hemoglobin 11.8  (*) 12.0 - 15.0 (g/dL)   HCT 14.7  82.9 - 56.2 (%)   MCV 95.2  78.0 - 100.0 (fL)   MCH 29.8  26.0 - 34.0 (pg)   MCHC 31.3  30.0 - 36.0 (g/dL)   RDW 13.0  86.5 - 78.4 (%)   Platelets 177  150 - 400 (K/uL)  DIFFERENTIAL     Status: Normal   Collection Time   06/06/11  1:30 PM      Component Value Range   Neutrophils Relative 64  43 - 77 (%)   Neutro Abs 4.5  1.7 - 7.7 (K/uL)   Lymphocytes Relative 28  12 - 46 (%)   Lymphs Abs 2.0  0.7 - 4.0 (K/uL)   Monocytes Relative 5  3 - 12 (%)   Monocytes Absolute 0.3  0.1 - 1.0 (K/uL)   Eosinophils Relative 2  0 - 5 (%)   Eosinophils Absolute 0.2  0.0 - 0.7 (K/uL)   Basophils Relative 0  0 - 1 (%)   Basophils Absolute 0.0  0.0 - 0.1 (K/uL)    MAU Course  Procedures  MDM Did CBC to rule out appy or other acute abdomen process >> normal. Pelvic exam is characteristic for PID. I explained to her about that possibility and also about her trichomonas infection. She did not understand at first that it was sexually transmitted, even when I told her that she got it from her sexual partner. I explained it frankly and simply. Encouraged her to make sure her husband/partner is treated and that he notifies his other partners. Pt states she only has the one partner. Felt better after Toradol injection.  Assessment and Plan  A:  Pelvic pain      Menorrhagia      Probable PID      Trichomonas vaginitis  P:  Rocephin and Zithromax given here      Will give first dose of Flagyl here Then Rx for 5 days      Toradol injection given       Rx Ibuprofen        Suvan Stcyr 06/06/2011, 2:21 PM

## 2011-06-06 NOTE — MAU Note (Signed)
Patient states she has been having abdominal pain for a while. Is a patient at the [facility] and was diagnosed with ovarian cysts. Had a shot the stopped the bleeding in February and has a little spotting at this time.

## 2011-06-06 NOTE — MAU Note (Signed)
C/o severe pelvic cramping that started this Am around 0800; hx of ovarian cyst; had ultrasound around 05/30/11 in [facility]U; Got an injection in January to stop her heavy bleeding and she has not had a period since then; brought in some tissue that she passed this AM;

## 2011-06-06 NOTE — Discharge Instructions (Signed)
Pelvic Inflammatory Disease Pelvic inflammatory disease (PID) is an infection of some, or all, of the female pelvic organs. PID is caused by germs. HOME CARE  Take your medicine as told. Finish them even if you start to feel better.   Only take medicine as told by your doctor.   Do not have sex (intercourse) until the infection is gone.   Tell your sex partner you have PID. Treatment may be needed.   Keep your follow-up appointments.  GET HELP RIGHT AWAY IF:   You have a fever.   You have an increase in belly (abdominal) pain.   You start to get the chills.   You have pain when you pee (urinate).   You are not better after 72 hours.   Your sex partner tells you he or she has an STD.   You throw up (vomit).   You cannot take your medicines.  MAKE SURE YOU:   Understand these instructions.   Will watch your condition.   Will get help right away if you are not doing well or get worse.  Document Released: 06/08/2008 Document Revised: 03/01/2011 Document Reviewed: 07/12/2009 [facility] Patient Information 2012 [place], [place].[employer] Patient Information 2012 [place], [place].Trichomoniasis Trichomoniasis is an infection, caused by the Trichomonas organism, that affects both women and men. In women, the outer female genitalia and the vagina are affected. In men, the penis is mainly affected, but the prostate and other reproductive organs can also be involved. Trichomoniasis is a sexually transmitted disease (STD) and is most often passed to another person through sexual contact. The majority of people who get trichomoniasis do so from a sexual encounter and are also at risk for other STDs. CAUSES   Sexual intercourse with an infected partner.   It can be present in swimming pools or hot tubs.  SYMPTOMS   Abnormal gray-green frothy vaginal discharge in women.   Vaginal itching and irritation in women.   Itching and irritation of the area outside the vagina in women.    Penile discharge with or without pain in males.   Inflammation of the urethra (urethritis), causing painful urination.   Bleeding after sexual intercourse.  RELATED COMPLICATIONS  Pelvic inflammatory disease.   Infection of the uterus (endometritis).   Infertility.   Tubal (ectopic) pregnancy.   It can be associated with other STDs, including gonorrhea and chlamydia, hepatitis B, and HIV.  COMPLICATIONS DURING PREGNANCY  Early (premature) delivery.   Premature rupture of the membranes (PROM).   Low birth weight.  DIAGNOSIS   Visualization of Trichomonas under the microscope from the vagina discharge.   Ph of the vagina greater than 4.5, tested with a test tape.   Trich Rapid Test.   Culture of the organism, but this is not usually needed.   It may be found on a Pap test.   Having a "strawberry cervix,"which means the cervix looks very red like a strawberry.  TREATMENT   You may be given medication to fight the infection. Inform your caregiver if you could be or are pregnant. Some medications used to treat the infection should not be taken during pregnancy.   Over-the-counter medications or creams to decrease itching or irritation may be recommended.   Your sexual partner will need to be treated if infected.  HOME CARE INSTRUCTIONS   Take all medication prescribed by your caregiver.   Take over-the-counter medication for itching or irritation as directed by your caregiver.   Do not have sexual intercourse while you have  the infection.   Do not douche or wear tampons.   Discuss your infection with your partner, as your partner may have acquired the infection from you. Or, your partner may have been the person who transmitted the infection to you.   Have your sex partner examined and treated if necessary.   Practice safe, informed, and protected sex.   See your caregiver for other STD testing.  SEEK MEDICAL CARE IF:   You still have symptoms after you  finish the medication.   You have an oral temperature above 102 F (38.9 C).   You develop belly (abdominal) pain.   You have pain when you urinate.   You have bleeding after sexual intercourse.   You develop a rash.   The medication makes you sick or makes you throw up (vomit).  Document Released: 09/05/2000 Document Revised: 03/01/2011 Document Reviewed: 10/01/2008

## 2011-06-07 LAB — GC/CHLAMYDIA PROBE AMP, GENITAL: GC Probe Amp, Genital: NEGATIVE

## 2011-06-07 NOTE — MAU Provider Note (Signed)
Attestation of Attending Supervision of Advanced Practitioner: Evaluation and management procedures were performed by the PA/NP/CNM/OB Fellow under my supervision/collaboration. Chart reviewed, and agree with management and plan.  Keishaun Hazel, M.D. 06/07/2011 9:04 AM   

## 2011-06-18 ENCOUNTER — Ambulatory Visit (INDEPENDENT_AMBULATORY_CARE_PROVIDER_SITE_OTHER): Payer: Medicare Other | Admitting: Physician Assistant

## 2011-06-18 ENCOUNTER — Encounter: Payer: Self-pay | Admitting: Physician Assistant

## 2011-06-18 DIAGNOSIS — N920 Excessive and frequent menstruation with regular cycle: Secondary | ICD-10-CM

## 2011-06-18 NOTE — Patient Instructions (Signed)

## 2011-06-18 NOTE — Progress Notes (Signed)
Chief Complaint:  Results   Tiffany Blake is  46 y.o. [id number].  Patient's last menstrual period was 03/07/2011..   She presents complaining of Results . No complaints. No bleeding since Depo Injection. Was seen in [facility]U 06/05/11 and dx with trich. Reports treatment in [facility]U and partner treatment as well.   Obstetrical/Gynecological History: OB History    Grav Para Term Preterm Abortions TAB SAB Ect Mult Living   5 5 5  0 0 0 0 0 0 5      Past Medical History: Past Medical History  Diagnosis Date  . Asthma   . Sleep apnea   . Headache   . Depression     on meds  . Drug abuse 2002  . HSV (herpes simplex virus) infection   . Ovarian cyst     Past Surgical History: Past Surgical History  Procedure Date  . Cesarean section   . Tubal ligation     Family History: Family History  Problem Relation Age of Onset  . Anesthesia problems Neg Hx   . Diabetes Mother   . Alcohol abuse Father   . Liver disease Father     Social History: History  Substance Use Topics  . Smoking status: Never Smoker   . Smokeless tobacco: Never Used  . Alcohol Use: No    Allergies: No Known Allergies  Review of Systems - Negative except what has been reviewed in HPI  Physical Exam   Last menstrual period 03/07/2011.  General: General appearance - alert, well appearing, and in no distress and oriented to person, place, and time Mental status - alert, oriented to person, place, and time, Delayed but high functioning. Able to verbalize understanding  Focused Gynecological Exam: examination not indicated  Labs: Endometrium, biopsy INACTIVE ENDOMETRIUM WITH STROMAL DECIDUAL CHANGE CONSISTENT WITH PROGESTERONAL EFFECT. NO HYPERPLASIA OR CARCINOMA.   Assessment: Patient Active Problem List  Diagnoses  . Menorrhagia     Plan: Resolved with continuation of Depo Provera FU Depo Injection due April 9-23, 2013  Desa Rech E. 06/18/2011,3:34 PM

## 2011-07-04 ENCOUNTER — Ambulatory Visit (INDEPENDENT_AMBULATORY_CARE_PROVIDER_SITE_OTHER): Payer: Medicare Other

## 2011-07-04 VITALS — BP 140/90 | HR 82

## 2011-07-04 DIAGNOSIS — N920 Excessive and frequent menstruation with regular cycle: Secondary | ICD-10-CM

## 2011-07-04 MED ORDER — MEDROXYPROGESTERONE ACETATE 150 MG/ML IM SUSP
150.0000 mg | Freq: Once | INTRAMUSCULAR | Status: AC
  Administered 2011-07-04: 150 mg via INTRAMUSCULAR

## 2011-07-12 ENCOUNTER — Other Ambulatory Visit: Payer: Self-pay

## 2011-07-12 ENCOUNTER — Encounter (HOSPITAL_COMMUNITY): Payer: Self-pay | Admitting: *Deleted

## 2011-07-12 ENCOUNTER — Emergency Department (HOSPITAL_COMMUNITY)
Admission: EM | Admit: 2011-07-12 | Discharge: 2011-07-13 | Disposition: A | Discharge status: Home / Self Care | Payer: Medicare Other | Attending: Emergency Medicine | Admitting: Emergency Medicine

## 2011-07-12 ENCOUNTER — Emergency Department (HOSPITAL_COMMUNITY): Payer: Medicare Other

## 2011-07-12 DIAGNOSIS — G4730 Sleep apnea, unspecified: Secondary | ICD-10-CM | POA: Insufficient documentation

## 2011-07-12 DIAGNOSIS — IMO0002 Reserved for concepts with insufficient information to code with codable children: Secondary | ICD-10-CM | POA: Insufficient documentation

## 2011-07-12 DIAGNOSIS — X58XXXA Exposure to other specified factors, initial encounter: Secondary | ICD-10-CM | POA: Insufficient documentation

## 2011-07-12 DIAGNOSIS — J45909 Unspecified asthma, uncomplicated: Secondary | ICD-10-CM | POA: Insufficient documentation

## 2011-07-12 DIAGNOSIS — S43409A Unspecified sprain of unspecified shoulder joint, initial encounter: Secondary | ICD-10-CM

## 2011-07-12 LAB — CBC
MCH: 30.7 pg (ref 26.0–34.0)
MCV: 91.2 fL (ref 78.0–100.0)
Platelets: 215 10*3/uL (ref 150–400)
RDW: 13.4 % (ref 11.5–15.5)
WBC: 7.8 10*3/uL (ref 4.0–10.5)

## 2011-07-12 LAB — CARDIAC PANEL(CRET KIN+CKTOT+MB+TROPI)
CK, MB: 1.6 ng/mL (ref 0.3–4.0)
Total CK: 144 U/L (ref 7–177)

## 2011-07-12 NOTE — ED Notes (Addendum)
L back and shoulder tender to palpation. Also reports some swelling to L arm noted

## 2011-07-12 NOTE — ED Notes (Signed)
Patient woke up this am with left sided chest pain radiating to her left arm.  Patient took 2 advils with slight relief.  Patient became slight dizzy but denies any other symptoms

## 2011-07-13 LAB — POCT I-STAT, CHEM 8
Calcium, Ion: 1.22 mmol/L (ref 1.12–1.32)
Chloride: 107 mEq/L (ref 96–112)
HCT: 37 % (ref 36.0–46.0)
TCO2: 23 mmol/L (ref 0–100)

## 2011-07-13 LAB — POCT PREGNANCY, URINE: Preg Test, Ur: NEGATIVE

## 2011-07-13 MED ORDER — HYDROCODONE-ACETAMINOPHEN 5-500 MG PO TABS: 1.0000 | ORAL_TABLET | Freq: Four times a day (QID) | ORAL | Status: AC | PRN

## 2011-07-13 MED ORDER — KETOROLAC TROMETHAMINE 60 MG/2ML IM SOLN
60.0000 mg | Freq: Once | INTRAMUSCULAR | Status: AC
  Administered 2011-07-13: 60 mg via INTRAMUSCULAR

## 2011-07-13 MED ORDER — KETOROLAC TROMETHAMINE 30 MG/ML IJ SOLN: 30.0000 mg | Freq: Once | INTRAMUSCULAR | Status: DC

## 2011-07-13 MED ORDER — KETOROLAC TROMETHAMINE 60 MG/2ML IM SOLN
INTRAMUSCULAR | Status: AC
  Administered 2011-07-13: 60 mg via INTRAMUSCULAR
  Filled 2011-07-13: qty 2

## 2011-07-13 NOTE — Discharge Instructions (Signed)
Ligament Sprain  Ligaments are tough, fibrous tissues that hold bones together at the joints. A sprain can occur when a ligament is stretched. This injury may take several weeks to heal.  HOME CARE INSTRUCTIONS    Rest the injured area for as long as directed by your caregiver. Then slowly start using the joint as directed by your caregiver and as the pain allows.   Keep the affected joint raised if possible to lessen swelling.   Apply ice for 15 to 20 minutes to the injured area every couple hours for the first half day, then 3 to 4 times per day for the first 48 hours. Put the ice in a plastic bag and place a towel between the bag of ice and your skin.   Wear any splinting, casting, or elastic bandage applications as instructed.   Only take over-the-counter or prescription medicines for pain, discomfort, or fever as directed by your caregiver. Do not use aspirin immediately after the injury unless instructed by your caregiver. Aspirin can cause increased bleeding and bruising of the tissues.   If you were given crutches, continue to use them as instructed and do not resume weight bearing on the affected extremity until instructed.  SEEK MEDICAL CARE IF:    Your bruising, swelling, or pain increases.   You have cold and numb fingers or toes if your arm or leg was injured.  SEEK IMMEDIATE MEDICAL CARE IF:    Your toes are numb or blue if your leg was injured.   Your fingers are numb or blue if your arm was injured.   Your pain is not responding to medicines and continues to stay the same or gets worse.  MAKE SURE YOU:    Understand these instructions.   Will watch your condition.   Will get help right away if you are not doing well or get worse.  Document Released: 03/09/2000 Document Revised: 03/01/2011 Document Reviewed: 01/06/2008  [employer] Patient Information 2012 [employer].

## 2011-07-13 NOTE — ED Provider Notes (Addendum)
History     CSN: [id number]  Arrival date & time 07/12/11  2108   First MD Initiated Contact with Patient 07/12/11 2353      Chief Complaint  Patient presents with  . Chest Pain    (Consider location/radiation/quality/duration/timing/severity/associated sxs/prior treatment) Patient is a 46 y.o. female presenting with chest pain. The history is provided by the patient.  Chest Pain The chest pain began 12 - 24 hours ago. Chest pain occurs constantly. The chest pain is unchanged. Associated with: movement and palpation of the left posterior shoulder. At its most intense, the pain is at 10/10. The pain is currently at 10/10. The severity of the pain is severe. The quality of the pain is described as pounding. The pain does not radiate. Exacerbated by: moving and palpating the LUE. Pertinent negatives for primary symptoms include no fever, no fatigue, no syncope, no shortness of breath, no cough, no wheezing, no palpitations, no abdominal pain, no nausea, no vomiting and no altered mental status.  Pertinent negatives for associated symptoms include no numbness. She tried NSAIDs for the symptoms. Risk factors include obesity.  Pertinent negatives for past medical history include no MI.  Procedure history is negative for cardiac catheterization.     Past Medical History  Diagnosis Date  . Asthma   . Sleep apnea   . Headache   . Depression     on meds  . Drug abuse 2002  . HSV (herpes simplex virus) infection   . Ovarian cyst     Past Surgical History  Procedure Date  . Cesarean section   . Tubal ligation     Family History  Problem Relation Age of Onset  . Anesthesia problems Neg Hx   . Diabetes Mother   . Alcohol abuse Father   . Liver disease Father     History  Substance Use Topics  . Smoking status: Never Smoker   . Smokeless tobacco: Never Used  . Alcohol Use: No    OB History    Grav Para Term Preterm Abortions TAB SAB Ect Mult Living   5 5 5  0 0 0 0 0 0 5      Review of Systems  Constitutional: Negative for fever and fatigue.  Respiratory: Negative for cough, chest tightness, shortness of breath and wheezing.   Cardiovascular: Positive for chest pain. Negative for palpitations and syncope.  Gastrointestinal: Negative for nausea, vomiting and abdominal pain.  Neurological: Negative for numbness.  Psychiatric/Behavioral: Negative for altered mental status.  All other systems reviewed and are negative.    Allergies  Review of patient's allergies indicates no known allergies.  Home Medications   Current Outpatient Rx  Name Route Sig Dispense Refill  . CETIRIZINE HCL 10 MG PO TABS Oral Take 10 mg by mouth every morning.    Marland Kitchen CITALOPRAM HYDROBROMIDE 20 MG PO TABS Oral Take 20 mg by mouth every morning. Take with 40mg  dose    . CITALOPRAM HYDROBROMIDE 40 MG PO TABS Oral Take 40 mg by mouth every morning. Take with 20mg  dose    . FERROUS SULFATE 325 (65 FE) MG PO TABS Oral Take 325 mg by mouth 2 (two) times daily.    . ADULT MULTIVITAMIN W/MINERALS CH Oral Take 1 tablet by mouth daily.    . TRAMADOL HCL 50 MG PO TABS Oral Take 50 mg by mouth 4 (four) times daily as needed. For pain    . TRAZODONE HCL 150 MG PO TABS Oral Take 150 mg by  mouth at bedtime.    Marland Kitchen VALACYCLOVIR HCL 1 G PO TABS Oral Take 1,000 mg by mouth daily.      BP 122/76  Pulse 73  Temp(Src) 98.1 F (36.7 C) (Oral)  Resp 18  SpO2 100%  Physical Exam  Constitutional: She is oriented to person, place, and time. She appears well-developed and well-nourished. No distress.  HENT:  Head: Normocephalic and atraumatic.  Mouth/Throat: Oropharynx is clear and moist.  Eyes: Conjunctivae are normal. Pupils are equal, round, and reactive to light.  Neck: Normal range of motion. Neck supple.  Cardiovascular: Normal rate and regular rhythm.   Pulmonary/Chest: Effort normal and breath sounds normal.  Abdominal: Soft. Bowel sounds are normal.  Musculoskeletal: Normal range of  motion.       Negative NEERs test of the left shoulder but pain with passive and active ROM and palpation of the posterior shoulder  Neurological: She is alert and oriented to person, place, and time. She has normal reflexes.  Skin: Skin is warm and dry. She is not diaphoretic.  Psychiatric: She has a normal mood and affect.    ED Course  Procedures (including critical care time)  Labs Reviewed  POCT I-STAT, CHEM 8 - Abnormal; Notable for the following:    Glucose, Bld 104 (*)    All other components within normal limits  CARDIAC PANEL(CRET KIN+CKTOT+MB+TROPI)  CBC  POCT PREGNANCY, URINE   Dg Chest 2 View  07/12/2011  *RADIOLOGY REPORT*  Clinical Data: Left upper chest pain and shortness of breath.  CHEST - 2 VIEW  Comparison: Chest radiograph performed 12/27/2010  Findings: The lungs are well-aerated and clear.  There is no evidence of focal opacification, pleural effusion or pneumothorax.  The heart is normal in size; the mediastinal contour is within normal limits.  No acute osseous abnormalities are seen.  IMPRESSION: No acute cardiopulmonary process seen  Original Report Authenticated By: Tonia Ghent, M.D.     No diagnosis found.    MDM  Patient states she slept on the arm suspect shoulder strain PERC negative.  No leg swelling no OCP not bed bound no surgery no leg pain or swelling   Date: 07/13/2011  Rate: 82  Rhythm: normal sinus rhythm  QRS Axis: right  Intervals: normal  ST/T Wave abnormalities: normal  Conduction Disutrbances:none  Narrative Interpretation:   Old EKG Reviewed: unchanged    Return immediately for chest pain shortness of breath or any concerns follow up in 2 days with your family doctor    Prim Morace K Indonesia Mckeough-Rasch, MD 07/13/11 0345  Evanie Buckle K Amaiah Cristiano-Rasch, MD 07/13/11 [phone]

## 2011-07-19 ENCOUNTER — Emergency Department (HOSPITAL_COMMUNITY)
Admission: EM | Admit: 2011-07-19 | Discharge: 2011-07-19 | Disposition: A | Discharge status: Home / Self Care | Payer: Medicare Other | Attending: Emergency Medicine | Admitting: Emergency Medicine

## 2011-07-19 ENCOUNTER — Encounter (HOSPITAL_COMMUNITY): Payer: Self-pay | Admitting: *Deleted

## 2011-07-19 DIAGNOSIS — IMO0002 Reserved for concepts with insufficient information to code with codable children: Secondary | ICD-10-CM | POA: Insufficient documentation

## 2011-07-19 DIAGNOSIS — G4730 Sleep apnea, unspecified: Secondary | ICD-10-CM | POA: Insufficient documentation

## 2011-07-19 DIAGNOSIS — X58XXXA Exposure to other specified factors, initial encounter: Secondary | ICD-10-CM | POA: Insufficient documentation

## 2011-07-19 DIAGNOSIS — M25519 Pain in unspecified shoulder: Secondary | ICD-10-CM | POA: Insufficient documentation

## 2011-07-19 DIAGNOSIS — S46919A Strain of unspecified muscle, fascia and tendon at shoulder and upper arm level, unspecified arm, initial encounter: Secondary | ICD-10-CM

## 2011-07-19 DIAGNOSIS — J45909 Unspecified asthma, uncomplicated: Secondary | ICD-10-CM | POA: Insufficient documentation

## 2011-07-19 MED ORDER — NAPROXEN 375 MG PO TABS: 375.0000 mg | ORAL_TABLET | Freq: Three times a day (TID) | ORAL | Status: DC

## 2011-07-19 MED ORDER — IBUPROFEN 800 MG PO TABS
800.0000 mg | ORAL_TABLET | Freq: Once | ORAL | Status: AC
Start: 2011-07-19 — End: 2011-07-19
  Administered 2011-07-19: 800 mg via ORAL
  Filled 2011-07-19: qty 1

## 2011-07-19 NOTE — ED Notes (Signed)
Pt states she is having left shoulder pain. Pt states it hurts to move her arm. Pt denies any injury. Pt does have knot

## 2011-07-19 NOTE — Discharge Instructions (Signed)
Ligament Sprain Ligaments are tough, fibrous tissues that hold bones together at the joints. A sprain can occur when a ligament is stretched. This injury may take several weeks to heal. HOME CARE INSTRUCTIONS   Rest the injured area for as long as directed by your caregiver. Then slowly start using the joint as directed by your caregiver and as the pain allows.   Keep the affected joint raised if possible to lessen swelling.   Apply ice for 15 to 20 minutes to the injured area every couple hours for the first half day, then 3 to 4 times per day for the first 48 hours. Put the ice in a plastic bag and place a towel between the bag of ice and your skin.   Wear any splinting, casting, or elastic bandage applications as instructed.   Only take over-the-counter or prescription medicines for pain, discomfort, or fever as directed by your caregiver. Do not use aspirin immediately after the injury unless instructed by your caregiver. Aspirin can cause increased bleeding and bruising of the tissues.   If you were given crutches, continue to use them as instructed and do not resume weight bearing on the affected extremity until instructed.  SEEK MEDICAL CARE IF:   Your bruising, swelling, or pain increases.   You have cold and numb fingers or toes if your arm or leg was injured.  SEEK IMMEDIATE MEDICAL CARE IF:   Your toes are numb or blue if your leg was injured.   Your fingers are numb or blue if your arm was injured.   Your pain is not responding to medicines and continues to stay the same or gets worse.  MAKE SURE YOU:   Understand these instructions.   Will watch your condition.   Will get help right away if you are not doing well or get worse.  Document Released: 03/09/2000 Document Revised: 03/01/2011 Document Reviewed: 01/06/2008 [employer] Patient Information 2012 [employer].Joint Sprain A sprain is a tear or stretch in the ligaments that hold a joint together. Severe sprains  may need as long as 3-6 weeks of immobilization and/or exercises to heal completely. Sprained joints should be rested and protected. If not, they can become unstable and prone to re-injury. Proper treatment can reduce your pain, shorten the period of disability, and reduce the risk of repeated injuries. TREATMENT   Rest and elevate the injured joint to reduce pain and swelling.   Apply ice packs to the injury for 20-30 minutes every 2-3 hours for the next 2-3 days.   Keep the injury wrapped in a compression bandage or splint as long as the joint is painful or as instructed by your caregiver.   Do not use the injured joint until it is completely healed to prevent re-injury and chronic instability. Follow the instructions of your caregiver.   Long-term sprain management may require exercises and/or treatment by a physical therapist. Taping or special braces may help stabilize the joint until it is completely better.  SEEK MEDICAL CARE IF:   You develop increased pain or swelling of the joint.   You develop increasing redness and warmth of the joint.   You develop a fever.   It becomes stiff.   Your hand or foot gets cold or numb.  Document Released: 04/19/2004 Document Revised: 03/01/2011 Document Reviewed: 03/29/2008 [employer] Patient Information 2012 [employer]. 

## 2011-07-19 NOTE — ED Provider Notes (Signed)
History     CSN: [id number]  Arrival date & time 07/19/11  1419   First MD Initiated Contact with Patient 07/19/11 1506      Chief Complaint  Patient presents with  . Shoulder Pain    (Consider location/radiation/quality/duration/timing/severity/associated sxs/prior treatment) HPI  Pt presents to the ED with complaints of shoulder pain for two weeks. Pt was seen one week ago for the same and treated with pain medication but she states it is still hurting. She denies chest pain, SOB, palpations. It hurts worse when she moves it and less when she holds it still. She has tried a heating pad and ice. She has also tried Vicodin but states it did not help. She does not have any weakness, numbness or tingling in the arm. No neck pain, headaches or swelling of the neck or arm.  Past Medical History  Diagnosis Date  . Asthma   . Sleep apnea   . Headache   . Depression     on meds  . Drug abuse 2002  . HSV (herpes simplex virus) infection   . Ovarian cyst     Past Surgical History  Procedure Date  . Cesarean section   . Tubal ligation     Family History  Problem Relation Age of Onset  . Anesthesia problems Neg Hx   . Diabetes Mother   . Alcohol abuse Father   . Liver disease Father     History  Substance Use Topics  . Smoking status: Never Smoker   . Smokeless tobacco: Never Used  . Alcohol Use: No    OB History    Grav Para Term Preterm Abortions TAB SAB Ect Mult Living   5 5 5  0 0 0 0 0 0 5      Review of Systems   HEENT: denies blurry vision or change in hearing PULMONARY: Denies difficulty breathing and SOB CARDIAC: denies chest pain or heart palpitations MUSCULOSKELETAL:  denies being unable to ambulate ABDOMEN AL: denies abdominal pain GU: denies loss of bowel or urinary control NEURO: denies numbness and tingling in extremities   Allergies  Review of patient's allergies indicates no known allergies.  Home Medications   Current Outpatient Rx    Name Route Sig Dispense Refill  . CETIRIZINE HCL 10 MG PO TABS Oral Take 10 mg by mouth every morning.    Marland Kitchen CITALOPRAM HYDROBROMIDE 40 MG PO TABS Oral Take 40 mg by mouth every morning. Take with 20mg  dose    . FERROUS SULFATE 325 (65 FE) MG PO TABS Oral Take 325 mg by mouth 2 (two) times daily.    Marland Kitchen HYDROCODONE-ACETAMINOPHEN 5-500 MG PO TABS Oral Take 1 tablet by mouth every 6 (six) hours as needed for pain. 10 tablet 0  . ADULT MULTIVITAMIN W/MINERALS CH Oral Take 1 tablet by mouth daily.    . TRAMADOL HCL 50 MG PO TABS Oral Take 50 mg by mouth 4 (four) times daily as needed. For pain    . TRAZODONE HCL 150 MG PO TABS Oral Take 150 mg by mouth at bedtime.    Marland Kitchen VALACYCLOVIR HCL 1 G PO TABS Oral Take 1,000 mg by mouth daily.    Marland Kitchen NAPROXEN 375 MG PO TABS Oral Take 1 tablet (375 mg total) by mouth 3 (three) times daily with meals. 30 tablet 0    BP 126/82  Pulse 82  Temp 98.6 F (37 C)  Resp 20  SpO2 100%  Physical Exam  Nursing note and  vitals reviewed. Constitutional: She appears well-developed and well-nourished. No distress.  HENT:  Head: Normocephalic and atraumatic.  Eyes: Pupils are equal, round, and reactive to light.  Neck: Normal range of motion. Neck supple.  Cardiovascular: Normal rate and regular rhythm.   Pulmonary/Chest: Effort normal.  Abdominal: Soft.  Musculoskeletal:       Left shoulder: She exhibits pain.       Arms:       Equal strength to bilateral upper extremities. Neurosensory function adequate to both arms. Skin color is normal. Skin is warm and moist. I see no step off deformity, no bony tenderness. Pt is able to ambulate without limp. Pain is relieved when arm is held up. ROM is decreased due to pain. No crepitus, laceration, effusion, swelling.  Pulses are normal  NEERS positive.   Neurological: She is alert.  Skin: Skin is warm and dry.    ED Course  Procedures (including critical care time)  Labs Reviewed - No data to display No results  found.   1. Shoulder strain       MDM  Pt given arm sling and warned on the dangers of staying in the sling for more than 1 week as instructed. Orthopedic referral given as well as 800mg  Ibuprofen in ED.  Pt has been advised of the symptoms that warrant their return to the ED. Patient has voiced understanding and has agreed to follow-up with the PCP or specialist.         Dorthula Matas, PA 07/19/11 [phone]

## 2011-07-23 NOTE — ED Provider Notes (Signed)
Medical screening examination/treatment/procedure(s) were performed by non-physician practitioner and as supervising physician I was immediately available for consultation/collaboration.   Magen Suriano, MD 07/23/11 0801 

## 2011-08-28 ENCOUNTER — Encounter (HOSPITAL_COMMUNITY): Payer: Self-pay

## 2011-08-28 ENCOUNTER — Inpatient Hospital Stay (HOSPITAL_COMMUNITY)
Admission: AD | Admit: 2011-08-28 | Discharge: 2011-08-28 | Disposition: A | Discharge status: Home / Self Care | Payer: Medicare Other | Source: Ambulatory Visit | Attending: Obstetrics & Gynecology | Admitting: Obstetrics & Gynecology

## 2011-08-28 DIAGNOSIS — R197 Diarrhea, unspecified: Secondary | ICD-10-CM | POA: Insufficient documentation

## 2011-08-28 DIAGNOSIS — R21 Rash and other nonspecific skin eruption: Secondary | ICD-10-CM | POA: Insufficient documentation

## 2011-08-28 LAB — URINALYSIS, ROUTINE W REFLEX MICROSCOPIC
Bilirubin Urine: NEGATIVE
Glucose, UA: NEGATIVE mg/dL
Ketones, ur: NEGATIVE mg/dL
Leukocytes, UA: NEGATIVE
Nitrite: NEGATIVE
Specific Gravity, Urine: 1.02 (ref 1.005–1.030)
pH: 6 (ref 5.0–8.0)

## 2011-08-28 NOTE — MAU Note (Signed)
Doesn't have periods, on depo, can't remember when last shot was.

## 2011-08-28 NOTE — MAU Provider Note (Signed)
History     CSN: [id number]  Arrival date and time: 08/28/11 1549   First Provider Initiated Contact with Patient 08/28/11 1725      Chief Complaint  Patient presents with  . Rectal Pain  . Possible Pregnancy   HPI  History limited as patient is very poor historian (limited memory of current or past events).  46 year old female with diarrhea x 3 months.  Denies nausea or vomiting.  No sick contacts.  Doesn't remember how many bowel movements per day, but more than six per day.  Bowel movements are brown.  Denies current abdominal pain.  Cramppy abdominal pain throughout abdomen at times.  Sometimes notices a very small amount of blood in bowel movement.  Denies melena or blood clots per rectum.  Has not tried anything for diarrhea.  No exacerbating or relieving factors.  No fever, chills, sweats.  No sick contacts.  No travel history in the past year.  Has never lived outside the country.  No food allergies or sensitivities.  No recent changes to diet.  Has skin irritation on buttox.  Pertinent Gynecological History: Menses: Patient had last menstrual period an unknown amount of time ago.  She received the depo shot for menorrhagia, but more than three months ago and has not started bleeding since. Bleeding: None  Contraception: tubal ligation.   Blood transfusions: none Sexually transmitted diseases: Trichomonas in March Previous GYN Procedures: None  Last mammogram: Has never had Date: Has never had. Last pap: Patient doesn't known Date: Patient has no recollection.  At least a year, but doesn't remember more than that.   Past Medical History  Diagnosis Date  . Asthma   . Sleep apnea   . Headache   . Depression     on meds  . Drug abuse 2002  . HSV (herpes simplex virus) infection   . Ovarian cyst     Past Surgical History  Procedure Date  . Cesarean section   . Tubal ligation     Family History  Problem Relation Age of Onset  . Anesthesia problems Neg Hx     . Hypotension Neg Hx   . Malignant hyperthermia Neg Hx   . Pseudochol deficiency Neg Hx   . Diabetes Mother   . Alcohol abuse Father   . Liver disease Father     History  Substance Use Topics  . Smoking status: Never Smoker   . Smokeless tobacco: Never Used  . Alcohol Use: No    Allergies: No Known Allergies  Prescriptions prior to admission  Medication Sig Dispense Refill  . cetirizine (ZYRTEC) 10 MG tablet Take 10 mg by mouth every morning.      . citalopram (CELEXA) 40 MG tablet Take 40 mg by mouth every morning. Take with 20mg  dose      . ferrous sulfate 325 (65 FE) MG tablet Take 325 mg by mouth 2 (two) times daily.      . Multiple Vitamin (MULITIVITAMIN WITH MINERALS) TABS Take 1 tablet by mouth daily.      . naproxen (NAPROSYN) 375 MG tablet Take 1 tablet (375 mg total) by mouth 3 (three) times daily with meals.  30 tablet  0  . traMADol (ULTRAM) 50 MG tablet Take 50 mg by mouth 4 (four) times daily as needed. For pain      . traZODone (DESYREL) 150 MG tablet Take 150 mg by mouth at bedtime.      . valACYclovir (VALTREX) 1000 MG tablet Take  1,000 mg by mouth daily.      Med list reviewed with patient.  She doesn't have a current medication list with her and doesn't remember her medications.  ROS  See HPI for ROS.  Relevant ROS otherwise negative. Physical Exam   Blood pressure 134/73, pulse 81, temperature 99 F (37.2 C), temperature source Oral, resp. rate 20, height 5\' 1"  (1.549 m), weight 112.038 kg (247 lb).  Physical Exam  Constitutional: She is oriented to person, place, and time. She appears well-developed and well-nourished. No distress.  HENT:  Head: Normocephalic and atraumatic.  Eyes: Conjunctivae are normal. Right eye exhibits no discharge. Left eye exhibits no discharge. No scleral icterus.  Neck: No tracheal deviation present.  Cardiovascular:       Extr warm and well perfused.  Respiratory: Effort normal. No stridor. No respiratory distress.  GI:  Soft. She exhibits no distension and no mass. There is no tenderness. There is no rebound and no guarding.  Genitourinary:       Rash on buttox, no abcess.  Neurological: She is alert and oriented to person, place, and time.  Skin: Skin is warm and dry. She is not diaphoretic.  Psychiatric:       Giggly, affect often inappropriate to situation.    MAU Course  Procedures   Assessment and Plan  Patient with longstanding diarrhea and rash, probably secondary to diarrhea.  No urgent abdominal problem.  Referred to primary care doctor.  Clancy Gourd 08/28/2011, 5:25 PM

## 2011-08-28 NOTE — MAU Note (Signed)
When goes to the bathroom, comes out like water, when wipes it hurts (watery stool, past 3 months).  Want to take a preg test.  Sometimes she pees on herself.

## 2011-08-28 NOTE — MAU Note (Signed)
Pt has noted watery stool x3 months, "couldn't stay out of bathroom". Anus feels raw and tender. Having lower abdominal pain, notes intermittently that she pees on herself. Denies uti s/s. Has been on depo for years, has not had menstrual cycle in years.

## 2011-08-28 NOTE — Discharge Instructions (Signed)
RESOURCE GUIDE  Chronic Pain Problems: Contact [facility]  [phone] Patients need to be referred by their primary care doctor.  Insufficient Money for Medicine: Contact United Way:  call "211" or [facility] [phone].  No Primary Care Doctor: - Call [facility]  [phone] - can help you locate a primary care doctor that  accepts your insurance, provides certain services, etc. - Physician Referral Service- 1-800-533-3463  Agencies that provide inexpensive medical care: - [facility]  [phone] - [facility]  [phone] - [facility]  [phone] - [facility]  [phone] - Planned Parenthood  [phone] - [facility]  [phone]  Medicaid-accepting [place] Providers: - [facility]- [address]  [phone], Mon-Fri 9am-7pm, Sat 9am-1pm - [facility]- [address], [address]  [phone] - [facility]- [address], [address]  [phone] - [facility]- [address] [place] [address]  [phone] - Veita B[facility]- [address], [phone]  Only accepts [place] Access Medicaid patients after they have their name  applied to their card  Self Pay (no insurance) in [place]: - Sickle Cell Patients: Dr Eric Dean, [facility]  [address], [phone] - [facility]- [address]  [phone]       -     [facility] [place]- [address]       -     [facility]- see information above (Speak to Pam H if you do not have insurance)       -  [facility]- [address], [phone]       -  [facility] [place]- [address],  [phone]       -  [facility]- [address] [place] [address], [phone]       -  Dr Osei-Bonsu-  [address], [place], [phone]       -  [facility]- [address], [phone]       -  [facility]- [address] [place] [address], [phone], also [address], [phone]       -    [facility]- [address], [phone], 1st & 3rd Saturday   every month, 10am-1pm  1) Find a Doctor and Pay Out of Pocket Although you won't have to find out who is covered by your insurance plan, it is a good idea to ask around and get recommendations. You will then need to call the office and see if the doctor you have chosen will accept you as a new patient and what types of options they offer for patients who are self-pay. Some doctors offer discounts or will set up payment plans for their patients who do not have insurance, but you will need to ask so you aren't surprised when you get to your appointment.  2) Contact Your Local Health Department Not all health departments have doctors that can see patients for sick visits, but many do, so it is worth a call to see if yours does. If you don't know where your local health department is, you can check in your phone book. The CDC also has a tool to help you locate your state's health department, and many state websites also have   listings of all of their local health departments.  3) Find a Walk-in Clinic If your illness is not likely to be very severe or complicated, you may want to try a walk in clinic. These are popping up all over the country in pharmacies, drugstores, and shopping centers. They're usually staffed by nurse practitioners or physician assistants that have been trained to treat common illnesses and complaints. They're usually fairly quick and inexpensive. However, if you have serious medical issues or chronic medical problems, these are probably not your best option  STD Testing - [place] [facility] [place], [facility] Clinic, [address], [place], phone [phone] or 1-877-539-9860.  Monday - Friday, call for an appointment. - [place]  [facility] [place], [facility] Clinic, [address], [place], phone [phone] or 1-877-539-9860.  Monday - Friday, call for an appointment.  Abuse/Neglect: - [place] Child Abuse Hotline (336) 641-3795 - [place] Child Abuse Hotline 800-378-5315 (After Hours)  Emergency Shelter:  [facility] (336) 271-5985  Maternity Homes: - [facility] (336) 275-9566 - [facility] (704) 372-4663  MRSA Hotline #:   832-7006  [place] Resources  [facility] [place]  [employer] [place] [facility] [address]                 [address]         [address]  [place]                                               [place]                              [place] Phone:  [phone]                                  Phone:  [phone]                   Phone:  [phone]  [place] [facility], [phone] - [place] Services - [facility]- 1-888-581-9988       -     [facility] in [place], [address],                                  336-349-4454, Insurance  [place] Child Abuse Hotline (336) 342-1394 or (336) 342-3537 (After Hours)   Behavioral Health Services  Substance Abuse Resources: - [facility]  336-882-2125 - [facility] 336-784-9470 - [facility] 336-285-9073 - [facility] 336-845-3988 - Residential & Outpatient Substance Abuse Program  800-659-3381  Psychological Services: -  Health  [phone] - [facility]  [phone] - [place] [facility], [address], [place], ACCESS LINE: 1-800-853-5163 or 336-641-4981, Http://www.[facility].com/services/adult.htm  Dental Assistance  If unable to pay or uninsured, contact:  [facility] or [place] [facility] to become qualified for the adult dental  clinic.  Patients with Medicaid:  [facility] [address], [phone], [phone]  If unable   to pay, or uninsured, contact [facility] (402) 538-6542) or [facility] Department [phone] in [place], [phone] in [facility]) to become qualified for the adult dental clinic  Other Low-Cost Community Dental Services: - [facility]- [address] [place], [place], [place], [address], [phone], [address], 2nd and 4th Thursday of the month at 6:30am.  10 clients each day by appointment, can sometimes see walk-in patients if someone does not show for an appointment. [facility]- [address] [place], [place], [address], [phone] - [facility]- [address] [place], [place], [address], [phone] [facility]- [phone] [facility] Health Department- [phone] [facility] [phone]   Regarding your RASH:  Use desitin or another over the counter diaper ointment.

## 2011-09-19 ENCOUNTER — Ambulatory Visit: Payer: Medicare Other

## 2011-11-08 ENCOUNTER — Emergency Department (HOSPITAL_COMMUNITY)
Admission: EM | Admit: 2011-11-08 | Discharge: 2011-11-08 | Disposition: A | Discharge status: Home / Self Care | Payer: Medicare Other | Attending: Emergency Medicine | Admitting: Emergency Medicine

## 2011-11-08 ENCOUNTER — Emergency Department (HOSPITAL_COMMUNITY): Payer: Medicare Other

## 2011-11-08 ENCOUNTER — Encounter (HOSPITAL_COMMUNITY): Payer: Self-pay | Admitting: Emergency Medicine

## 2011-11-08 DIAGNOSIS — G4730 Sleep apnea, unspecified: Secondary | ICD-10-CM | POA: Insufficient documentation

## 2011-11-08 DIAGNOSIS — Z833 Family history of diabetes mellitus: Secondary | ICD-10-CM | POA: Insufficient documentation

## 2011-11-08 DIAGNOSIS — N83209 Unspecified ovarian cyst, unspecified side: Secondary | ICD-10-CM | POA: Insufficient documentation

## 2011-11-08 DIAGNOSIS — B9789 Other viral agents as the cause of diseases classified elsewhere: Secondary | ICD-10-CM | POA: Insufficient documentation

## 2011-11-08 DIAGNOSIS — Z6379 Other stressful life events affecting family and household: Secondary | ICD-10-CM | POA: Insufficient documentation

## 2011-11-08 DIAGNOSIS — B009 Herpesviral infection, unspecified: Secondary | ICD-10-CM | POA: Insufficient documentation

## 2011-11-08 DIAGNOSIS — F1910 Other psychoactive substance abuse, uncomplicated: Secondary | ICD-10-CM | POA: Insufficient documentation

## 2011-11-08 DIAGNOSIS — R51 Headache: Secondary | ICD-10-CM | POA: Insufficient documentation

## 2011-11-08 DIAGNOSIS — J45909 Unspecified asthma, uncomplicated: Secondary | ICD-10-CM | POA: Insufficient documentation

## 2011-11-08 DIAGNOSIS — F3289 Other specified depressive episodes: Secondary | ICD-10-CM | POA: Insufficient documentation

## 2011-11-08 DIAGNOSIS — N390 Urinary tract infection, site not specified: Secondary | ICD-10-CM | POA: Insufficient documentation

## 2011-11-08 DIAGNOSIS — B349 Viral infection, unspecified: Secondary | ICD-10-CM

## 2011-11-08 DIAGNOSIS — F329 Major depressive disorder, single episode, unspecified: Secondary | ICD-10-CM | POA: Insufficient documentation

## 2011-11-08 DIAGNOSIS — Z8379 Family history of other diseases of the digestive system: Secondary | ICD-10-CM | POA: Insufficient documentation

## 2011-11-08 DIAGNOSIS — Z8489 Family history of other specified conditions: Secondary | ICD-10-CM | POA: Insufficient documentation

## 2011-11-08 LAB — URINALYSIS, ROUTINE W REFLEX MICROSCOPIC
Glucose, UA: NEGATIVE mg/dL
Protein, ur: NEGATIVE mg/dL
Specific Gravity, Urine: 1.027 (ref 1.005–1.030)
Urobilinogen, UA: 0.2 mg/dL (ref 0.0–1.0)

## 2011-11-08 LAB — COMPREHENSIVE METABOLIC PANEL
ALT: 24 U/L (ref 0–35)
AST: 18 U/L (ref 0–37)
Alkaline Phosphatase: 73 U/L (ref 39–117)
CO2: 28 mEq/L (ref 19–32)
Calcium: 9.1 mg/dL (ref 8.4–10.5)
Chloride: 97 mEq/L (ref 96–112)
GFR calc non Af Amer: 79 mL/min — ABNORMAL LOW (ref >90)
Potassium: 4.1 mEq/L (ref 3.5–5.1)
Sodium: 133 mEq/L — ABNORMAL LOW (ref 135–145)
Total Bilirubin: 0.4 mg/dL (ref 0.3–1.2)

## 2011-11-08 LAB — CBC WITH DIFFERENTIAL/PLATELET
Basophils Absolute: 0 10*3/uL (ref 0.0–0.1)
Lymphocytes Relative: 24 % (ref 12–46)
Lymphs Abs: 2.1 10*3/uL (ref 0.7–4.0)
Neutro Abs: 6.4 10*3/uL (ref 1.7–7.7)
Platelets: 205 10*3/uL (ref 150–400)
RBC: 4.06 MIL/uL (ref 3.87–5.11)
RDW: 12.1 % (ref 11.5–15.5)
WBC: 9 10*3/uL (ref 4.0–10.5)

## 2011-11-08 LAB — URINE MICROSCOPIC-ADD ON

## 2011-11-08 MED ORDER — ACETAMINOPHEN 500 MG PO TABS
ORAL_TABLET | ORAL | Status: AC
  Filled 2011-11-08: qty 1

## 2011-11-08 MED ORDER — SODIUM CHLORIDE 0.9 % IV BOLUS (SEPSIS)
1000.0000 mL | Freq: Once | INTRAVENOUS | Status: AC
  Administered 2011-11-08: 1000 mL via INTRAVENOUS

## 2011-11-08 MED ORDER — ACETAMINOPHEN 500 MG PO TABS
1000.0000 mg | ORAL_TABLET | Freq: Once | ORAL | Status: AC
  Administered 2011-11-08: 1000 mg via ORAL
  Filled 2011-11-08: qty 1

## 2011-11-08 MED ORDER — SULFAMETHOXAZOLE-TRIMETHOPRIM 800-160 MG PO TABS: 1.0000 | ORAL_TABLET | Freq: Two times a day (BID) | ORAL | Status: AC

## 2011-11-08 NOTE — ED Provider Notes (Signed)
History     CSN: [id number]  Arrival date & time 11/08/11  0944   First MD Initiated Contact with Patient 11/08/11 1004      Chief Complaint  Patient presents with  . Generalized Body Aches    (Consider location/radiation/quality/duration/timing/severity/associated sxs/prior treatment) HPI Comments: Patient reports she has had generalized body aches and has felt cold since yesterday.  Has associated nasal congestion, rhinorrhea, cough, abdominal pain, and constipation.  Also notes a month and a half of urinary frequency.  Denies known fevers, ear pain, sore throat, vomiting.  LMP reported to be July 25.  Denies abnormal vaginal discharge or bleeding.  Denies rash.    The history is provided by the patient.    Past Medical History  Diagnosis Date  . Asthma   . Sleep apnea   . Headache   . Depression     on meds  . Drug abuse 2002  . HSV (herpes simplex virus) infection   . Ovarian cyst     Past Surgical History  Procedure Date  . Cesarean section   . Tubal ligation     Family History  Problem Relation Age of Onset  . Anesthesia problems Neg Hx   . Hypotension Neg Hx   . Malignant hyperthermia Neg Hx   . Pseudochol deficiency Neg Hx   . Diabetes Mother   . Alcohol abuse Father   . Liver disease Father     History  Substance Use Topics  . Smoking status: Never Smoker   . Smokeless tobacco: Never Used  . Alcohol Use: No    OB History    Grav Para Term Preterm Abortions TAB SAB Ect Mult Living   5 5 5  0 0 0 0 0 0 5      Review of Systems  Constitutional: Positive for chills. Negative for fever.  HENT: Positive for congestion and rhinorrhea. Negative for ear pain, sore throat and trouble swallowing.   Respiratory: Positive for cough.   Gastrointestinal: Positive for abdominal pain and constipation. Negative for nausea, vomiting and diarrhea.  Genitourinary: Positive for frequency. Negative for dysuria, urgency, vaginal bleeding and vaginal discharge.    Neurological: Negative for syncope, weakness and numbness.    Allergies  Review of patient's allergies indicates no known allergies.  Home Medications   Current Outpatient Rx  Name Route Sig Dispense Refill  . CETIRIZINE HCL 10 MG PO TABS Oral Take 10 mg by mouth every morning.    Marland Kitchen CITALOPRAM HYDROBROMIDE 20 MG PO TABS Oral Take 20 mg by mouth daily. Take with 40mg  dose to equal 60mg  daily.    Marland Kitchen CITALOPRAM HYDROBROMIDE 40 MG PO TABS Oral Take 40 mg by mouth daily. Take with 20mg  dose to equal 60mg  daily.    Marland Kitchen FERROUS SULFATE 325 (65 FE) MG PO TABS Oral Take 325 mg by mouth 2 (two) times daily.    . TRAZODONE HCL 150 MG PO TABS Oral Take 150 mg by mouth at bedtime.      BP 134/73  Pulse 80  Temp 97.5 F (36.4 C) (Oral)  Resp 20  Ht 5\' 2"  (1.575 m)  SpO2 99%  Physical Exam  Nursing note and vitals reviewed. Constitutional: She appears well-developed and well-nourished. No distress.  HENT:  Head: Normocephalic and atraumatic.  Mouth/Throat: Oropharynx is clear and moist. No oropharyngeal exudate.  Neck: Normal range of motion. Neck supple.  Cardiovascular: Normal rate and regular rhythm.   Pulmonary/Chest: Effort normal and breath sounds normal. No respiratory  distress. She has no wheezes. She has no rales.  Abdominal: Soft. She exhibits no distension. There is tenderness in the suprapubic area. There is no rebound and no guarding.  Musculoskeletal: She exhibits no edema and no tenderness.  Neurological: She is alert. She exhibits normal muscle tone.  Skin: She is not diaphoretic.    ED Course  Procedures (including critical care time)  Labs Reviewed  URINALYSIS, ROUTINE W REFLEX MICROSCOPIC - Abnormal; Notable for the following:    APPearance CLOUDY (*)     Hgb urine dipstick TRACE (*)     Leukocytes, UA TRACE (*)     All other components within normal limits  COMPREHENSIVE METABOLIC PANEL - Abnormal; Notable for the following:    Sodium 133 (*)     Glucose, Bld  107 (*)     GFR calc non Af Amer 79 (*)     All other components within normal limits  URINE MICROSCOPIC-ADD ON - Abnormal; Notable for the following:    Bacteria, UA MANY (*)     Casts HYALINE CASTS (*)     All other components within normal limits  CBC WITH DIFFERENTIAL  URINE CULTURE   Dg Chest 2 View  11/08/2011  *RADIOLOGY REPORT*  Clinical Data: Weakness, body aches  CHEST - 2 VIEW  Comparison: None.  Findings: Cardiomediastinal silhouette is stable.  No acute infiltrate or pleural effusion.  No pulmonary edema.  Bony thorax is stable.  IMPRESSION: No active disease.  No significant change.  Original Report Authenticated By: Natasha Mead, M.D.   12:05 PM Pt sleeping soundly, easily aroused.   Patient reports she is feeling better after tylenol and IVF.   1. Viral illness   2. UTI (lower urinary tract infection)       MDM  Pt with generalized body aches and URI symptoms, also with 1.5 months urinary urgency.  Pt's abdomen is nonsurgical, pt is afebrile.  No clinical evidence of pyelo.  Xray is negative, pt O2 sat is 99%, doubt pneumonia.  No meningismus.  UA shows many bacteria and pt  Has urinary urgency, will treat for UTI.  Pt d/c home with bactrim for UTI, general instructions for caring for self during viral illness.  Discussed all results with patient.  Pt given return precautions.  Pt verbalizes understanding and agrees with plan.           Tiffany Blake, Tiffany Blake 11/08/11 1419

## 2011-11-08 NOTE — ED Notes (Signed)
Per pt states her body hurts all over

## 2011-11-08 NOTE — ED Notes (Signed)
Off floor for testing 

## 2011-11-09 LAB — URINE CULTURE: Special Requests: NORMAL

## 2011-11-09 NOTE — ED Provider Notes (Signed)
Medical screening examination/treatment/procedure(s) were performed by non-physician practitioner and as supervising physician I was immediately available for consultation/collaboration.   Shaneeka Scarboro, MD 11/09/11 0720 

## 2011-12-31 ENCOUNTER — Emergency Department (HOSPITAL_COMMUNITY)
Admission: EM | Admit: 2011-12-31 | Discharge: 2011-12-31 | Disposition: A | Discharge status: Home / Self Care | Payer: Medicare Other | Attending: Emergency Medicine | Admitting: Emergency Medicine

## 2011-12-31 ENCOUNTER — Encounter (HOSPITAL_COMMUNITY): Payer: Self-pay

## 2011-12-31 DIAGNOSIS — Z79899 Other long term (current) drug therapy: Secondary | ICD-10-CM | POA: Insufficient documentation

## 2011-12-31 DIAGNOSIS — N83202 Unspecified ovarian cyst, left side: Secondary | ICD-10-CM

## 2011-12-31 DIAGNOSIS — J45909 Unspecified asthma, uncomplicated: Secondary | ICD-10-CM | POA: Insufficient documentation

## 2011-12-31 DIAGNOSIS — L03119 Cellulitis of unspecified part of limb: Secondary | ICD-10-CM | POA: Insufficient documentation

## 2011-12-31 DIAGNOSIS — L03116 Cellulitis of left lower limb: Secondary | ICD-10-CM

## 2011-12-31 DIAGNOSIS — L02419 Cutaneous abscess of limb, unspecified: Secondary | ICD-10-CM | POA: Insufficient documentation

## 2011-12-31 DIAGNOSIS — A599 Trichomoniasis, unspecified: Secondary | ICD-10-CM

## 2011-12-31 DIAGNOSIS — N83209 Unspecified ovarian cyst, unspecified side: Secondary | ICD-10-CM | POA: Insufficient documentation

## 2011-12-31 LAB — URINE MICROSCOPIC-ADD ON

## 2011-12-31 LAB — WET PREP, GENITAL: WBC, Wet Prep HPF POC: NONE SEEN

## 2011-12-31 LAB — CBC WITH DIFFERENTIAL/PLATELET
Basophils Relative: 0 % (ref 0–1)
Eosinophils Absolute: 0.3 10*3/uL (ref 0.0–0.7)
Hemoglobin: 11.9 g/dL — ABNORMAL LOW (ref 12.0–15.0)
MCH: 29.7 pg (ref 26.0–34.0)
MCHC: 32.7 g/dL (ref 30.0–36.0)
Monocytes Absolute: 0.3 10*3/uL (ref 0.1–1.0)
Monocytes Relative: 3 % (ref 3–12)
Neutrophils Relative %: 68 % (ref 43–77)

## 2011-12-31 LAB — BASIC METABOLIC PANEL
BUN: 15 mg/dL (ref 6–23)
Creatinine, Ser: 0.75 mg/dL (ref 0.50–1.10)
GFR calc Af Amer: >90 mL/min (ref >90)
GFR calc non Af Amer: >90 mL/min (ref >90)
Potassium: 4.2 mEq/L (ref 3.5–5.1)

## 2011-12-31 LAB — URINALYSIS, ROUTINE W REFLEX MICROSCOPIC
Bilirubin Urine: NEGATIVE
Ketones, ur: NEGATIVE mg/dL
Nitrite: NEGATIVE
Urobilinogen, UA: 0.2 mg/dL (ref 0.0–1.0)

## 2011-12-31 MED ORDER — PNEUMOCOCCAL VAC POLYVALENT 25 MCG/0.5ML IJ INJ: 0.5000 mL | INJECTION | INTRAMUSCULAR | Status: DC

## 2011-12-31 MED ORDER — SULFAMETHOXAZOLE-TMP DS 800-160 MG PO TABS
1.0000 | ORAL_TABLET | Freq: Once | ORAL | Status: AC
  Administered 2011-12-31: 1 via ORAL
  Filled 2011-12-31: qty 1

## 2011-12-31 MED ORDER — MORPHINE SULFATE 4 MG/ML IJ SOLN
4.0000 mg | INTRAMUSCULAR | Status: DC | PRN
  Administered 2011-12-31: 4 mg via INTRAVENOUS
  Filled 2011-12-31: qty 1

## 2011-12-31 MED ORDER — METRONIDAZOLE 500 MG PO TABS
2000.0000 mg | ORAL_TABLET | Freq: Once | ORAL | Status: AC
  Administered 2011-12-31: 2000 mg via ORAL
  Filled 2011-12-31: qty 4

## 2011-12-31 MED ORDER — INFLUENZA VIRUS VACC SPLIT PF IM SUSP: 0.5000 mL | INTRAMUSCULAR | Status: DC

## 2011-12-31 MED ORDER — CEPHALEXIN 500 MG PO CAPS: 500.0000 mg | ORAL_CAPSULE | Freq: Four times a day (QID) | ORAL | Status: DC

## 2011-12-31 MED ORDER — SULFAMETHOXAZOLE-TRIMETHOPRIM 800-160 MG PO TABS: 1.0000 | ORAL_TABLET | Freq: Two times a day (BID) | ORAL | Status: DC

## 2011-12-31 MED ORDER — NAPROXEN 500 MG PO TABS: 500.0000 mg | ORAL_TABLET | Freq: Two times a day (BID) | ORAL | Status: DC

## 2011-12-31 NOTE — ED Notes (Signed)
Patient reports that she was told that she had cyst present, but patient states she does not know where. [facility]." patient denies having gallstones.

## 2011-12-31 NOTE — ED Notes (Signed)
Pt. Set up for pelvic exam. Pt. Has under garments are off. Nurse was notified

## 2011-12-31 NOTE — ED Notes (Signed)
Pt c/o of left sided abd pain, states that she was seen at [facility] where she was told she had a left ovarian cyst. Also c/o of left lower leg pain and swelling, states that it is hot and burning. Let is warm and dry to touch, redden area noted below the knee/

## 2011-12-31 NOTE — ED Provider Notes (Signed)
History     CSN: [id number]  Arrival date & time 12/31/11  1112   First MD Initiated Contact with Patient 12/31/11 1237      No chief complaint on file.   (Consider location/radiation/quality/duration/timing/severity/associated sxs/prior treatment) HPI  46 year old female presents with multiple complaints. Patient reports for the past 4 days she has been having pain to the left lower leg. Onset has been gradual, persistent, and radiates throughout her leg. Her symptoms beginning after she helped a friend move some furniture. She noticed redness and swelling to the left lower leg. Pain is described as a sharp and throbbing sensation worsening with palpation. Has tried over-the-counter pain medication without relief. She denies itch, or notice any insect bite. Unsure of tick exposure, but denies fever, headache. Patient denies taking birth control pill, having recent surgery, having prolonged bed rest, or long trip ride.    Patient also reports abdominal pain for the past 2-3 days. Pain is located to the left lower quadrant. She described pain as a throbbing sensation that feels similar to ovarian cyst. She also endorsed vaginal bleeding with the same duration. Nothing makes the pain better or worse. She denies nausea, vomiting, constipation, dysuria, or vaginal discharge. She does endorse having 2-3 bouts of diarrhea that is nonbloody and non-mucous for the past 2 days. No recent antibiotic use.    Past Medical History  Diagnosis Date  . Asthma   . Sleep apnea   . Headache   . Depression     on meds  . Drug abuse 2002  . HSV (herpes simplex virus) infection   . Ovarian cyst     Past Surgical History  Procedure Date  . Cesarean section   . Tubal ligation     Family History  Problem Relation Age of Onset  . Anesthesia problems Neg Hx   . Hypotension Neg Hx   . Malignant hyperthermia Neg Hx   . Pseudochol deficiency Neg Hx   . Diabetes Mother   . Alcohol abuse Father   .  Liver disease Father     History  Substance Use Topics  . Smoking status: Never Smoker   . Smokeless tobacco: Never Used  . Alcohol Use: No    OB History    Grav Para Term Preterm Abortions TAB SAB Ect Mult Living   5 5 5  0 0 0 0 0 0 5      Review of Systems  All other systems reviewed and are negative.    Allergies  Review of patient's allergies indicates no known allergies.  Home Medications   Current Outpatient Rx  Name Route Sig Dispense Refill  . CETIRIZINE HCL 10 MG PO TABS Oral Take 10 mg by mouth every morning.    Marland Kitchen CITALOPRAM HYDROBROMIDE 20 MG PO TABS Oral Take 20 mg by mouth daily. Take with 40mg  dose to equal 60mg  daily.    Marland Kitchen CITALOPRAM HYDROBROMIDE 40 MG PO TABS Oral Take 40 mg by mouth daily. Take with 20mg  dose to equal 60mg  daily.    Marland Kitchen FERROUS SULFATE 325 (65 FE) MG PO TABS Oral Take 325 mg by mouth 2 (two) times daily.    . TRAZODONE HCL 150 MG PO TABS Oral Take 150 mg by mouth at bedtime.      BP 129/68  Pulse 84  Temp 98.4 F (36.9 C) (Oral)  Resp 18  SpO2 97%  Physical Exam  Nursing note and vitals reviewed. Constitutional: She appears well-developed and well-nourished.  Morbidly obese  HENT:  Head: Normocephalic and atraumatic.  Mouth/Throat: Oropharynx is clear and moist.  Eyes: Conjunctivae normal are normal.  Neck: Neck supple.  Cardiovascular: Normal rate and regular rhythm.   Pulmonary/Chest: Effort normal and breath sounds normal. No respiratory distress. She has no wheezes. She has no rales. She exhibits no tenderness.  Abdominal: She exhibits no distension. There is tenderness (ttp to left lower quadrant, and suprapubic region without guarding or rebound tenderness). There is no rebound. Hernia confirmed negative in the right inguinal area and confirmed negative in the left inguinal area.  Genitourinary: Pelvic exam was performed with patient supine. There is no tenderness on the right labia. There is no tenderness on the left  labia. Cervix exhibits no motion tenderness. Right adnexum displays no tenderness and no fullness. Left adnexum displays tenderness. Left adnexum displays no fullness. There is bleeding around the vagina. No erythema or tenderness around the vagina. No foreign body around the vagina. No vaginal discharge found.       Chaperone present:  Exam limited due to large body habitus.    Musculoskeletal:       BLE: no palpable cords, negative homan's sign, no pedal edema.  LLE with area of erythema measuring 5cmx8cm to anterior leg without induration or fluctuance, ttp.  Neurological: She is alert.  Skin: Skin is warm.    ED Course  Procedures (including critical care time)  Results for orders placed during the hospital encounter of 12/31/11  CBC WITH DIFFERENTIAL      Component Value Range   WBC 7.6  4.0 - 10.5 K/uL   RBC 4.01  3.87 - 5.11 MIL/uL   Hemoglobin 11.9 (*) 12.0 - 15.0 g/dL   HCT 16.1  09.6 - 04.5 %   MCV 90.8  78.0 - 100.0 fL   MCH 29.7  26.0 - 34.0 pg   MCHC 32.7  30.0 - 36.0 g/dL   RDW 40.9  81.1 - 91.4 %   Platelets 221  150 - 400 K/uL   Neutrophils Relative 68  43 - 77 %   Neutro Abs 5.1  1.7 - 7.7 K/uL   Lymphocytes Relative 25  12 - 46 %   Lymphs Abs 1.9  0.7 - 4.0 K/uL   Monocytes Relative 3  3 - 12 %   Monocytes Absolute 0.3  0.1 - 1.0 K/uL   Eosinophils Relative 4  0 - 5 %   Eosinophils Absolute 0.3  0.0 - 0.7 K/uL   Basophils Relative 0  0 - 1 %   Basophils Absolute 0.0  0.0 - 0.1 K/uL  BASIC METABOLIC PANEL      Component Value Range   Sodium 136  135 - 145 mEq/L   Potassium 4.2  3.5 - 5.1 mEq/L   Chloride 102  96 - 112 mEq/L   CO2 27  19 - 32 mEq/L   Glucose, Bld 107 (*) 70 - 99 mg/dL   BUN 15  6 - 23 mg/dL   Creatinine, Ser 7.82  0.50 - 1.10 mg/dL   Calcium 9.0  8.4 - 95.6 mg/dL   GFR calc non Af Amer >90  >90 mL/min   GFR calc Af Amer >90  >90 mL/min  URINALYSIS, ROUTINE W REFLEX MICROSCOPIC      Component Value Range   Color, Urine YELLOW  YELLOW     APPearance CLOUDY (*) CLEAR   Specific Gravity, Urine 1.020  1.005 - 1.030   pH 6.0  5.0 -  8.0   Glucose, UA NEGATIVE  NEGATIVE mg/dL   Hgb urine dipstick LARGE (*) NEGATIVE   Bilirubin Urine NEGATIVE  NEGATIVE   Ketones, ur NEGATIVE  NEGATIVE mg/dL   Protein, ur 30 (*) NEGATIVE mg/dL   Urobilinogen, UA 0.2  0.0 - 1.0 mg/dL   Nitrite NEGATIVE  NEGATIVE   Leukocytes, UA SMALL (*) NEGATIVE  PREGNANCY, URINE      Component Value Range   Preg Test, Ur NEGATIVE  NEGATIVE  WET PREP, GENITAL      Component Value Range   Yeast Wet Prep HPF POC NONE SEEN  NONE SEEN   Trich, Wet Prep FEW (*) NONE SEEN   Clue Cells Wet Prep HPF POC FEW (*) NONE SEEN   WBC, Wet Prep HPF POC NONE SEEN  NONE SEEN  URINE MICROSCOPIC-ADD ON      Component Value Range   Squamous Epithelial / LPF FEW (*) RARE   WBC, UA 0-2  <3 WBC/hpf   RBC / HPF TOO NUMEROUS TO COUNT  <3 RBC/hpf   Urine-Other TRICHOMONAS PRESENT     No results found.  1. Left lower leg cellulitis 2. Abdominal pain.     MDM  Pt with 2 different complaints.  Pain and redness to LLE x 4 days.  Skin changes consistent with cellulitis, no evidence of abscess on exam.  Low suspicion for DVT.    Lower abd pain with vaginal bleeding, and prior hx of ovarian cyst.  Is afebrile with normal VS.  Will check labs, perform pelvic exam, and will control pain.  Low suspicion for appy, tuboovarian abscess, or ovarian torsion based on history.     3:26 PM Pelvic exam with L adnexal tenderness suggestive of ovarian cyst pain as she has similar pain in the past.  No evidence suggestive of PID.  No CMT.  Wet prep shows evidence of trich infection.  Will treat with Flagyl 2g PO.  Will also treat for cellulitis with Bactrim and Keflex.  Will prescribe naprosen for pain .  Otherwise, pt stable to be d/c.  Recommend return in 2 days to reassess for for cellulitis.  No systemic signs of infection.  Pt has no active diarrhea in here ER.     BP 129/68  Pulse 84   Temp 98.4 F (36.9 C) (Oral)  Resp 18  SpO2 97%  Nursing notes reviewed and considered in documentation  Previous records reviewed and considered  All labs/vitals reviewed and considered  xrays reviewed and considered   Fayrene Helper, PA-C 12/31/11 1640

## 2011-12-31 NOTE — Progress Notes (Signed)
Pt confirmed with [facility] CM that she is seen at [facility] at [facility], [place])

## 2012-01-01 NOTE — ED Provider Notes (Signed)
Medical screening examination/treatment/procedure(s) were conducted as a shared visit with non-physician practitioner(s) and myself.  I personally evaluated the patient during the encounter. Left lower extremity reveals minor cellulitis.  Pelvic exam per physician's assistant  Donnetta Hutching, MD 01/01/12 1415

## 2012-01-31 ENCOUNTER — Emergency Department (HOSPITAL_COMMUNITY): Payer: Medicare Other

## 2012-01-31 ENCOUNTER — Emergency Department (HOSPITAL_COMMUNITY)
Admission: EM | Admit: 2012-01-31 | Discharge: 2012-01-31 | Disposition: A | Discharge status: Home / Self Care | Payer: Medicare Other | Attending: Emergency Medicine | Admitting: Emergency Medicine

## 2012-01-31 DIAGNOSIS — F3289 Other specified depressive episodes: Secondary | ICD-10-CM | POA: Insufficient documentation

## 2012-01-31 DIAGNOSIS — F329 Major depressive disorder, single episode, unspecified: Secondary | ICD-10-CM | POA: Insufficient documentation

## 2012-01-31 DIAGNOSIS — Z23 Encounter for immunization: Secondary | ICD-10-CM | POA: Insufficient documentation

## 2012-01-31 DIAGNOSIS — W540XXA Bitten by dog, initial encounter: Secondary | ICD-10-CM | POA: Insufficient documentation

## 2012-01-31 DIAGNOSIS — Y939 Activity, unspecified: Secondary | ICD-10-CM | POA: Insufficient documentation

## 2012-01-31 DIAGNOSIS — Y92410 Unspecified street and highway as the place of occurrence of the external cause: Secondary | ICD-10-CM | POA: Insufficient documentation

## 2012-01-31 DIAGNOSIS — Z79899 Other long term (current) drug therapy: Secondary | ICD-10-CM | POA: Insufficient documentation

## 2012-01-31 DIAGNOSIS — S6990XA Unspecified injury of unspecified wrist, hand and finger(s), initial encounter: Secondary | ICD-10-CM | POA: Insufficient documentation

## 2012-01-31 DIAGNOSIS — G4730 Sleep apnea, unspecified: Secondary | ICD-10-CM | POA: Insufficient documentation

## 2012-01-31 DIAGNOSIS — J45909 Unspecified asthma, uncomplicated: Secondary | ICD-10-CM | POA: Insufficient documentation

## 2012-01-31 DIAGNOSIS — Z8619 Personal history of other infectious and parasitic diseases: Secondary | ICD-10-CM | POA: Insufficient documentation

## 2012-01-31 DIAGNOSIS — Z8742 Personal history of other diseases of the female genital tract: Secondary | ICD-10-CM | POA: Insufficient documentation

## 2012-01-31 MED ORDER — RABIES IMMUNE GLOBULIN 150 UNIT/ML IM INJ
2100.0000 [IU] | INJECTION | Freq: Once | INTRAMUSCULAR | Status: AC
  Administered 2012-01-31: 2100 [IU] via INTRAMUSCULAR
  Filled 2012-01-31: qty 14

## 2012-01-31 MED ORDER — RABIES VACCINE, PCEC IM SUSR
1.0000 mL | Freq: Once | INTRAMUSCULAR | Status: AC
  Administered 2012-01-31: 1 mL via INTRAMUSCULAR
  Filled 2012-01-31: qty 1

## 2012-01-31 MED ORDER — TETANUS-DIPHTH-ACELL PERTUSSIS 5-2.5-18.5 LF-MCG/0.5 IM SUSP
0.5000 mL | Freq: Once | INTRAMUSCULAR | Status: AC
  Administered 2012-01-31: 0.5 mL via INTRAMUSCULAR
  Filled 2012-01-31: qty 0.5

## 2012-01-31 MED ORDER — RABIES IMMUNE GLOBULIN 150 UNIT/ML IM INJ: 75.0000 [IU] | INJECTION | INTRAMUSCULAR | Status: DC

## 2012-01-31 MED ORDER — HYDROCODONE-ACETAMINOPHEN 5-325 MG PO TABS: 1.0000 | ORAL_TABLET | Freq: Four times a day (QID) | ORAL | Status: DC | PRN

## 2012-01-31 MED ORDER — OXYCODONE-ACETAMINOPHEN 5-325 MG PO TABS
1.0000 | ORAL_TABLET | Freq: Once | ORAL | Status: AC
  Administered 2012-01-31: 1 via ORAL
  Filled 2012-01-31: qty 1

## 2012-01-31 MED ORDER — AMOXICILLIN-POT CLAVULANATE 875-125 MG PO TABS: 1.0000 | ORAL_TABLET | Freq: Two times a day (BID) | ORAL | Status: DC

## 2012-01-31 NOTE — ED Provider Notes (Signed)
History     CSN: [id number]  Arrival date & time 01/31/12  1447   First MD Initiated Contact with Patient 01/31/12 1539      Chief Complaint  Patient presents with  . Animal Bite    (Consider location/radiation/quality/duration/timing/severity/associated sxs/prior treatment) HPI Comments: Pt with street dog bite to left little finger. Denies skin breaking, blood loss. Owner of dog unknown and has not seen dog before. She reports pain and swelling.   Patient is a 46 y.o. female presenting with animal bite. The history is provided by the patient.  Animal Bite  The incident occurred just prior to arrival. The incident occurred in the street. She came to the ER via personal transport. There is an injury to the left little finger. The pain is moderate. It is unlikely that a foreign body is present. Pertinent negatives include no fussiness, no numbness, no visual disturbance, no nausea, no vomiting, no headaches, no inability to bear weight, no neck pain, no focal weakness, no decreased responsiveness, no light-headedness, no loss of consciousness, no seizures, no tingling, no weakness, no cough and no difficulty breathing. There have been no prior injuries to these areas. She is right-handed. Her tetanus status is out of date. She has been behaving normally. There were no sick contacts.    Past Medical History  Diagnosis Date  . Asthma   . Sleep apnea   . Headache   . Depression     on meds  . Drug abuse 2002  . HSV (herpes simplex virus) infection   . Ovarian cyst     Past Surgical History  Procedure Date  . Cesarean section   . Tubal ligation     Family History  Problem Relation Age of Onset  . Anesthesia problems Neg Hx   . Hypotension Neg Hx   . Malignant hyperthermia Neg Hx   . Pseudochol deficiency Neg Hx   . Diabetes Mother   . Alcohol abuse Father   . Liver disease Father     History  Substance Use Topics  . Smoking status: Never Smoker   . Smokeless tobacco:  Never Used  . Alcohol Use: No    OB History    Grav Para Term Preterm Abortions TAB SAB Ect Mult Living   5 5 5  0 0 0 0 0 0 5      Review of Systems  Constitutional: Negative for fever, diaphoresis, activity change and decreased responsiveness.  HENT: Negative for congestion and neck pain.   Eyes: Negative for visual disturbance.  Respiratory: Negative for cough.   Gastrointestinal: Negative for nausea and vomiting.  Genitourinary: Negative for dysuria.  Musculoskeletal: Negative for myalgias.  Skin: Negative for color change and wound.  Neurological: Negative for tingling, focal weakness, seizures, loss of consciousness, weakness, light-headedness, numbness and headaches.  All other systems reviewed and are negative.    Allergies  Review of patient's allergies indicates no known allergies.  Home Medications   Current Outpatient Rx  Name  Route  Sig  Dispense  Refill  . CEPHALEXIN 500 MG PO CAPS   Oral   Take 500 mg by mouth 4 (four) times daily.         Marland Kitchen CETIRIZINE HCL 10 MG PO TABS   Oral   Take 10 mg by mouth every morning.         Marland Kitchen CITALOPRAM HYDROBROMIDE 20 MG PO TABS   Oral   Take 20 mg by mouth daily. Take with 40mg  dose to  equal 60mg  daily.         Marland Kitchen CITALOPRAM HYDROBROMIDE 40 MG PO TABS   Oral   Take 40 mg by mouth daily. Take with 20mg  dose to equal 60mg  daily.         Marland Kitchen FERROUS SULFATE 325 (65 FE) MG PO TABS   Oral   Take 325 mg by mouth 2 (two) times daily.         . TRAZODONE HCL 150 MG PO TABS   Oral   Take 150 mg by mouth at bedtime.           BP 130/93  Pulse 75  Temp 98.5 F (36.9 C)  Resp 18  SpO2 98%  Physical Exam  Constitutional: She is oriented to person, place, and time. She appears well-developed and well-nourished. No distress.  HENT:  Head: Normocephalic and atraumatic.  Mouth/Throat: Oropharynx is clear and moist. No oropharyngeal exudate.  Eyes: Conjunctivae normal and EOM are normal. Pupils are equal,  round, and reactive to light. No scleral icterus.  Neck: Normal range of motion. Neck supple. No tracheal deviation present. No thyromegaly present.  Cardiovascular: Normal rate, regular rhythm, normal heart sounds and intact distal pulses.   Pulmonary/Chest: Effort normal and breath sounds normal. No stridor. No respiratory distress. She has no wheezes.  Abdominal: Soft.  Musculoskeletal:       Full flexion and extension of digits & wrist. Pain free supination/pronation. Left hand w ttp along 5th digit and medial aspect of palmer and dorsal surfaces.   Neurological: She is alert and oriented to person, place, and time. Coordination normal.  Skin: Skin is warm and dry. She is not diaphoretic. No pallor.       Soft tissue swelling of left medial hand. No warmth or erythema. Skin intact, no visible evidence of wound  Psychiatric: She has a normal mood and affect. Her behavior is normal.    ED Course  Procedures (including critical care time)  Labs Reviewed - No data to display Dg Hand Complete Left  01/31/2012  *RADIOLOGY REPORT*  Clinical Data: Pain post trauma  LEFT HAND - COMPLETE 3+ VIEW  Comparison: None.  Findings:  Frontal, oblique, and lateral views were obtained.  No fracture or dislocation.  Joint spaces appear intact.  No radiopaque foreign body.  There is soft tissue swelling along the medial volar aspect of the hand.  IMPRESSION: Soft tissue swelling.  No radiopaque foreign body.  No fracture or bony destruction.   Original Report Authenticated By: Bretta Bang, M.D.      No diagnosis found.    MDM  Dog bite  Patient presents emergency department complaining of dog bite that occurred just prior to arrival.  Imaging reviewed and exam positive for tenderness on palpation, but no signs of visible abrasions or lacerations.  Due to tenderness on exam and unknown animal patient will be treated with rabies prophylaxis, abx and tetanus.  If worsened pain or erythema develops  advised prompt return to emergency department & if symptoms do not begin to improve in the next 3 days. f-u with hand.  Patient is to followup to continue rabies shots with urgent care. Pt verbalizes understanding & is agreeable with plain.         Jaci Carrel, [place] 01/31/12 1719

## 2012-01-31 NOTE — ED Notes (Signed)
Pt states she is taking the bus home.

## 2012-01-31 NOTE — ED Notes (Signed)
Pt states she was bit by a dog earlier today. 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 Pt c/o redness and swelling to R pinkie. No wound visible.

## 2012-02-01 NOTE — ED Notes (Signed)
Rabies shot schedule completed and faxed to  [facility] and pharmacy x 2.

## 2012-02-03 ENCOUNTER — Emergency Department (HOSPITAL_COMMUNITY)
Admission: EM | Admit: 2012-02-03 | Discharge: 2012-02-03 | Disposition: A | Discharge status: Home / Self Care | Payer: Medicare Other | Source: Home / Self Care

## 2012-02-03 ENCOUNTER — Emergency Department (HOSPITAL_COMMUNITY)
Admission: EM | Admit: 2012-02-03 | Discharge: 2012-02-03 | Disposition: A | Discharge status: Home / Self Care | Payer: Medicare Other | Source: Home / Self Care | Attending: Family Medicine | Admitting: Family Medicine

## 2012-02-03 ENCOUNTER — Encounter (HOSPITAL_COMMUNITY): Payer: Self-pay | Admitting: *Deleted

## 2012-02-03 MED ORDER — RABIES VACCINE, PCEC IM SUSR
INTRAMUSCULAR | Status: AC
  Filled 2012-02-03: qty 1

## 2012-02-03 MED ORDER — RABIES VACCINE, PCEC IM SUSR
1.0000 mL | Freq: Once | INTRAMUSCULAR | Status: AC
  Administered 2012-02-03: 1 mL via INTRAMUSCULAR

## 2012-02-03 NOTE — ED Notes (Signed)
#  2 rabies shot

## 2012-02-06 NOTE — ED Provider Notes (Signed)
Medical screening examination/treatment/procedure(s) were performed by non-physician practitioner and as supervising physician I was immediately available for consultation/collaboration.  Ande Therrell, MD 02/06/12 0053 

## 2012-02-07 ENCOUNTER — Encounter (HOSPITAL_COMMUNITY): Payer: Self-pay | Admitting: *Deleted

## 2012-02-07 ENCOUNTER — Emergency Department (HOSPITAL_COMMUNITY)
Admission: EM | Admit: 2012-02-07 | Discharge: 2012-02-07 | Disposition: A | Discharge status: Home / Self Care | Payer: Medicare Other | Source: Home / Self Care

## 2012-02-07 MED ORDER — RABIES VACCINE, PCEC IM SUSR
INTRAMUSCULAR | Status: AC
  Filled 2012-02-07: qty 1

## 2012-02-07 MED ORDER — RABIES VACCINE, PCEC IM SUSR
1.0000 mL | Freq: Once | INTRAMUSCULAR | Status: AC
  Administered 2012-02-07: 1 mL via INTRAMUSCULAR

## 2012-02-07 NOTE — ED Notes (Signed)
Pt here for rabies injection. 

## 2012-02-14 ENCOUNTER — Emergency Department (INDEPENDENT_AMBULATORY_CARE_PROVIDER_SITE_OTHER)
Admission: EM | Admit: 2012-02-14 | Discharge: 2012-02-14 | Disposition: A | Discharge status: Home / Self Care | Payer: Medicare Other | Source: Home / Self Care

## 2012-02-14 ENCOUNTER — Encounter (HOSPITAL_COMMUNITY): Payer: Self-pay | Admitting: Emergency Medicine

## 2012-02-14 DIAGNOSIS — Z23 Encounter for immunization: Secondary | ICD-10-CM

## 2012-02-14 MED ORDER — RABIES VACCINE, PCEC IM SUSR
INTRAMUSCULAR | Status: AC
  Filled 2012-02-14: qty 1

## 2012-02-14 MED ORDER — RABIES VACCINE, PCEC IM SUSR
1.0000 mL | Freq: Once | INTRAMUSCULAR | Status: AC
  Administered 2012-02-14: 1 mL via INTRAMUSCULAR

## 2012-02-14 NOTE — ED Notes (Signed)
Pt here for rabies injection only. No concerns.

## 2012-02-22 ENCOUNTER — Encounter (HOSPITAL_COMMUNITY): Payer: Self-pay | Admitting: Emergency Medicine

## 2012-02-22 ENCOUNTER — Emergency Department (INDEPENDENT_AMBULATORY_CARE_PROVIDER_SITE_OTHER)
Admission: EM | Admit: 2012-02-22 | Discharge: 2012-02-22 | Disposition: A | Discharge status: Home / Self Care | Payer: Medicare Other | Source: Home / Self Care

## 2012-02-22 DIAGNOSIS — W540XXA Bitten by dog, initial encounter: Secondary | ICD-10-CM

## 2012-02-22 DIAGNOSIS — Z203 Contact with and (suspected) exposure to rabies: Secondary | ICD-10-CM

## 2012-02-22 DIAGNOSIS — S6000XA Contusion of unspecified finger without damage to nail, initial encounter: Secondary | ICD-10-CM

## 2012-02-22 MED ORDER — RABIES VACCINE, PCEC IM SUSR
1.0000 mL | Freq: Once | INTRAMUSCULAR | Status: AC
  Administered 2012-02-22: 1 mL via INTRAMUSCULAR

## 2012-02-22 MED ORDER — RABIES VACCINE, PCEC IM SUSR
INTRAMUSCULAR | Status: AC
  Filled 2012-02-22: qty 1

## 2012-02-22 NOTE — ED Notes (Signed)
Pt is here for her last dose of rabies vac... Reports no concerns.

## 2012-04-08 ENCOUNTER — Encounter (HOSPITAL_COMMUNITY): Payer: Self-pay | Admitting: *Deleted

## 2012-04-08 ENCOUNTER — Emergency Department (HOSPITAL_COMMUNITY)
Admission: EM | Admit: 2012-04-08 | Discharge: 2012-04-08 | Disposition: A | Discharge status: Home / Self Care | Payer: Medicare Other | Attending: Emergency Medicine | Admitting: Emergency Medicine

## 2012-04-08 DIAGNOSIS — Z8619 Personal history of other infectious and parasitic diseases: Secondary | ICD-10-CM | POA: Insufficient documentation

## 2012-04-08 DIAGNOSIS — G4730 Sleep apnea, unspecified: Secondary | ICD-10-CM | POA: Insufficient documentation

## 2012-04-08 DIAGNOSIS — Z8742 Personal history of other diseases of the female genital tract: Secondary | ICD-10-CM | POA: Insufficient documentation

## 2012-04-08 DIAGNOSIS — Z79899 Other long term (current) drug therapy: Secondary | ICD-10-CM | POA: Insufficient documentation

## 2012-04-08 DIAGNOSIS — S161XXA Strain of muscle, fascia and tendon at neck level, initial encounter: Secondary | ICD-10-CM

## 2012-04-08 DIAGNOSIS — X58XXXA Exposure to other specified factors, initial encounter: Secondary | ICD-10-CM | POA: Insufficient documentation

## 2012-04-08 DIAGNOSIS — S4980XA Other specified injuries of shoulder and upper arm, unspecified arm, initial encounter: Secondary | ICD-10-CM | POA: Insufficient documentation

## 2012-04-08 DIAGNOSIS — J45909 Unspecified asthma, uncomplicated: Secondary | ICD-10-CM | POA: Insufficient documentation

## 2012-04-08 DIAGNOSIS — Y929 Unspecified place or not applicable: Secondary | ICD-10-CM | POA: Insufficient documentation

## 2012-04-08 DIAGNOSIS — S46909A Unspecified injury of unspecified muscle, fascia and tendon at shoulder and upper arm level, unspecified arm, initial encounter: Secondary | ICD-10-CM | POA: Insufficient documentation

## 2012-04-08 DIAGNOSIS — Z8659 Personal history of other mental and behavioral disorders: Secondary | ICD-10-CM | POA: Insufficient documentation

## 2012-04-08 DIAGNOSIS — Y939 Activity, unspecified: Secondary | ICD-10-CM | POA: Insufficient documentation

## 2012-04-08 DIAGNOSIS — S139XXA Sprain of joints and ligaments of unspecified parts of neck, initial encounter: Secondary | ICD-10-CM | POA: Insufficient documentation

## 2012-04-08 HISTORY — DX: Other muscle spasm: M62.838

## 2012-04-08 MED ORDER — CYCLOBENZAPRINE HCL 10 MG PO TABS: 10.0000 mg | ORAL_TABLET | Freq: Three times a day (TID) | ORAL | Status: DC | PRN

## 2012-04-08 MED ORDER — KETOROLAC TROMETHAMINE 60 MG/2ML IM SOLN
60.0000 mg | Freq: Once | INTRAMUSCULAR | Status: AC
Start: 2012-04-08 — End: 2012-04-08
  Administered 2012-04-08: 60 mg via INTRAMUSCULAR
  Filled 2012-04-08: qty 2

## 2012-04-08 MED ORDER — PREDNISONE 50 MG PO TABS: 50.0000 mg | ORAL_TABLET | Freq: Every day | ORAL | Status: DC

## 2012-04-08 MED ORDER — HYDROCODONE-ACETAMINOPHEN 5-325 MG PO TABS
1.0000 | ORAL_TABLET | Freq: Once | ORAL | Status: AC
  Administered 2012-04-08: 1 via ORAL
  Filled 2012-04-08: qty 1

## 2012-04-08 MED ORDER — HYDROCODONE-ACETAMINOPHEN 5-325 MG PO TABS: 1.0000 | ORAL_TABLET | Freq: Four times a day (QID) | ORAL | Status: DC | PRN

## 2012-04-08 NOTE — ED Notes (Signed)
Pt with hx of muscle spasms to neck and L shoulder since Nov c/o increased pain since last night.  Denies injury.  Pain increases with movement.

## 2012-04-08 NOTE — ED Notes (Signed)
Hx of "muscle spasms" in left neck/shoulder for "months". Worse today. No known injury.

## 2012-04-08 NOTE — ED Provider Notes (Signed)
History     CSN: [id number]  Arrival date & time 04/08/12  1151   First MD Initiated Contact with Patient 04/08/12 1225      Chief Complaint  Patient presents with  . Shoulder Pain  . Neck Pain    (Consider location/radiation/quality/duration/timing/severity/associated sxs/prior treatment) HPI Patient presents to the emergency department complaining of right shoulder and neck pain for an undisclosed amount of time, but multiple years. No injury or trauma. Patient describes the pain as muscle spasms in her shoulder and the right side of her neck, and says the pain is so severe that it hurts to even move her right arm. No weakness, paresthesias, chest pain, shortness of breath, back pain cyanosis, or loss of range of motion.   Past Medical History  Diagnosis Date  . Asthma   . Sleep apnea   . Headache   . Depression     on meds  . Drug abuse 2002  . HSV (herpes simplex virus) infection   . Ovarian cyst   . Muscle spasms of head or neck     Past Surgical History  Procedure Date  . Cesarean section   . Tubal ligation     Family History  Problem Relation Age of Onset  . Anesthesia problems Neg Hx   . Hypotension Neg Hx   . Malignant hyperthermia Neg Hx   . Pseudochol deficiency Neg Hx   . Diabetes Mother   . Alcohol abuse Father   . Liver disease Father     History  Substance Use Topics  . Smoking status: Never Smoker   . Smokeless tobacco: Never Used  . Alcohol Use: No    OB History    Grav Para Term Preterm Abortions TAB SAB Ect Mult Living   5 5 5  0 0 0 0 0 0 5      Review of Systems All other systems negative except as documented in the HPI. All pertinent positives and negatives as reviewed in the HPI.  Allergies  Review of patient's allergies indicates no known allergies.  Home Medications   Current Outpatient Rx  Name  Route  Sig  Dispense  Refill  . AMOXICILLIN-POT CLAVULANATE 875-125 MG PO TABS   Oral   Take 1 tablet by mouth every 12  (twelve) hours.   14 tablet   0   . CEPHALEXIN 500 MG PO CAPS   Oral   Take 500 mg by mouth 4 (four) times daily.         Marland Kitchen CETIRIZINE HCL 10 MG PO TABS   Oral   Take 10 mg by mouth every morning.         Marland Kitchen CITALOPRAM HYDROBROMIDE 20 MG PO TABS   Oral   Take 20 mg by mouth daily. Take with 40mg  dose to equal 60mg  daily.         Marland Kitchen CITALOPRAM HYDROBROMIDE 40 MG PO TABS   Oral   Take 40 mg by mouth daily. Take with 20mg  dose to equal 60mg  daily.         Marland Kitchen FERROUS SULFATE 325 (65 FE) MG PO TABS   Oral   Take 325 mg by mouth 2 (two) times daily.         Marland Kitchen HYDROCODONE-ACETAMINOPHEN 5-325 MG PO TABS   Oral   Take 1 tablet by mouth every 6 (six) hours as needed for pain.   15 tablet   0   . TRAZODONE HCL 150 MG PO TABS  Oral   Take 150 mg by mouth at bedtime.           BP 132/91  Pulse 85  Temp 97.6 F (36.4 C) (Oral)  Resp 20  SpO2 98%  LMP 03/17/2012  Physical Exam  Constitutional: She is oriented to person, place, and time. She appears well-developed and well-nourished. No distress.  HENT:  Head: Normocephalic and atraumatic.  Eyes: Pupils are equal, round, and reactive to light.  Neck: Normal range of motion. Neck supple.  Cardiovascular: Normal rate.   Pulmonary/Chest: Effort normal.  Musculoskeletal: Normal range of motion.       Right shoulder: She exhibits tenderness. She exhibits normal range of motion, no swelling, no deformity and normal strength.       Arms: Neurological: She is alert and oriented to person, place, and time. She has normal strength.       Normal strength, tone, and sensation in right arm and hand.   Skin: Skin is warm and dry.    ED Course  Procedures (including critical care time)  Patient be treated for cervical muscular strain and spasm.  Told to return to the emergency department as needed.  Follow up with her primary care Dr. for a recheck   MDM          Carlyle Dolly, PA-C 04/08/12 1337

## 2012-04-10 NOTE — ED Provider Notes (Signed)
Medical screening examination/treatment/procedure(s) were performed by non-physician practitioner and as supervising physician I was immediately available for consultation/collaboration.    Celene Kras, MD 04/10/12 [phone]

## 2012-07-16 ENCOUNTER — Encounter (HOSPITAL_COMMUNITY): Payer: Self-pay | Admitting: *Deleted

## 2012-07-16 ENCOUNTER — Emergency Department (HOSPITAL_COMMUNITY): Payer: Medicare Other

## 2012-07-16 ENCOUNTER — Emergency Department (HOSPITAL_COMMUNITY)
Admission: EM | Admit: 2012-07-16 | Discharge: 2012-07-16 | Disposition: A | Discharge status: Home / Self Care | Payer: Medicare Other | Attending: Emergency Medicine | Admitting: Emergency Medicine

## 2012-07-16 DIAGNOSIS — Z79899 Other long term (current) drug therapy: Secondary | ICD-10-CM | POA: Insufficient documentation

## 2012-07-16 DIAGNOSIS — F1910 Other psychoactive substance abuse, uncomplicated: Secondary | ICD-10-CM | POA: Insufficient documentation

## 2012-07-16 DIAGNOSIS — F329 Major depressive disorder, single episode, unspecified: Secondary | ICD-10-CM | POA: Insufficient documentation

## 2012-07-16 DIAGNOSIS — R05 Cough: Secondary | ICD-10-CM | POA: Insufficient documentation

## 2012-07-16 DIAGNOSIS — F3289 Other specified depressive episodes: Secondary | ICD-10-CM | POA: Insufficient documentation

## 2012-07-16 DIAGNOSIS — Z8742 Personal history of other diseases of the female genital tract: Secondary | ICD-10-CM | POA: Insufficient documentation

## 2012-07-16 DIAGNOSIS — Z8619 Personal history of other infectious and parasitic diseases: Secondary | ICD-10-CM | POA: Insufficient documentation

## 2012-07-16 DIAGNOSIS — J3489 Other specified disorders of nose and nasal sinuses: Secondary | ICD-10-CM | POA: Insufficient documentation

## 2012-07-16 DIAGNOSIS — R112 Nausea with vomiting, unspecified: Secondary | ICD-10-CM | POA: Insufficient documentation

## 2012-07-16 DIAGNOSIS — J45909 Unspecified asthma, uncomplicated: Secondary | ICD-10-CM | POA: Insufficient documentation

## 2012-07-16 DIAGNOSIS — R059 Cough, unspecified: Secondary | ICD-10-CM | POA: Insufficient documentation

## 2012-07-16 DIAGNOSIS — R079 Chest pain, unspecified: Secondary | ICD-10-CM | POA: Insufficient documentation

## 2012-07-16 DIAGNOSIS — Z8739 Personal history of other diseases of the musculoskeletal system and connective tissue: Secondary | ICD-10-CM | POA: Insufficient documentation

## 2012-07-16 LAB — CBC WITH DIFFERENTIAL/PLATELET
Basophils Absolute: 0 10*3/uL (ref 0.0–0.1)
Basophils Relative: 0 % (ref 0–1)
Eosinophils Absolute: 0.1 10*3/uL (ref 0.0–0.7)
Hemoglobin: 12.7 g/dL (ref 12.0–15.0)
MCHC: 33.7 g/dL (ref 30.0–36.0)
Monocytes Relative: 6 % (ref 3–12)
Neutro Abs: 4.4 10*3/uL (ref 1.7–7.7)
Neutrophils Relative %: 65 % (ref 43–77)
Platelets: 200 10*3/uL (ref 150–400)
RDW: 12.7 % (ref 11.5–15.5)

## 2012-07-16 LAB — BASIC METABOLIC PANEL
Chloride: 103 mEq/L (ref 96–112)
GFR calc Af Amer: >90 mL/min (ref >90)
GFR calc non Af Amer: 81 mL/min — ABNORMAL LOW (ref >90)
Potassium: 3.9 mEq/L (ref 3.5–5.1)
Sodium: 138 mEq/L (ref 135–145)

## 2012-07-16 MED ORDER — CETIRIZINE HCL 10 MG PO TABS: 10.0000 mg | ORAL_TABLET | Freq: Every day | ORAL | Status: DC

## 2012-07-16 MED ORDER — KETOROLAC TROMETHAMINE 30 MG/ML IJ SOLN
30.0000 mg | Freq: Once | INTRAMUSCULAR | Status: AC
  Administered 2012-07-16: 30 mg via INTRAVENOUS
  Filled 2012-07-16: qty 1

## 2012-07-16 MED ORDER — DIPHENHYDRAMINE HCL 50 MG/ML IJ SOLN
25.0000 mg | Freq: Once | INTRAMUSCULAR | Status: AC
  Administered 2012-07-16: 25 mg via INTRAVENOUS
  Filled 2012-07-16: qty 1

## 2012-07-16 MED ORDER — DEXTROMETHORPHAN POLISTIREX 30 MG/5ML PO LQCR: 60.0000 mg | ORAL | Status: DC | PRN

## 2012-07-16 NOTE — ED Notes (Signed)
Pt. oob to the bathroom, gait steady, pt. Eating a  Box lunch before leaving.  Bus pass given.

## 2012-07-16 NOTE — ED Notes (Signed)
Patient transported to X-ray 

## 2012-07-16 NOTE — ED Provider Notes (Signed)
History     CSN: [id number]  Arrival date & time 07/16/12  0810   First MD Initiated Contact with Patient 07/16/12 0813      Chief Complaint  Patient presents with  . Chest Pain    (Consider location/radiation/quality/duration/timing/severity/associated sxs/prior treatment) HPI Comments: Patient is a 47 year old female with a past medical history of asthma, GERD, and drug abuse who presents with central chest pain since last night. Patient reports gradual onset of chest pain before going to bed. Patient reports continuous pain that woke her up this morning from sleep. The pain is sharp, severe and does not radiate. She reports associated cough and congestion for the past week and nausea and vomiting since last night. No aggravating/alleviating factors.    Past Medical History  Diagnosis Date  . Asthma   . Sleep apnea   . Headache   . Depression     on meds  . Drug abuse 2002  . HSV (herpes simplex virus) infection   . Ovarian cyst   . Muscle spasms of head or neck     Past Surgical History  Procedure Laterality Date  . Cesarean section    . Tubal ligation      Family History  Problem Relation Age of Onset  . Anesthesia problems Neg Hx   . Hypotension Neg Hx   . Malignant hyperthermia Neg Hx   . Pseudochol deficiency Neg Hx   . Diabetes Mother   . Alcohol abuse Father   . Liver disease Father     History  Substance Use Topics  . Smoking status: Never Smoker   . Smokeless tobacco: Never Used  . Alcohol Use: No    OB History   Grav Para Term Preterm Abortions TAB SAB Ect Mult Living   5 5 5  0 0 0 0 0 0 5      Review of Systems  HENT: Positive for congestion.   Respiratory: Positive for cough.   Cardiovascular: Positive for chest pain.  Gastrointestinal: Positive for nausea and vomiting.  All other systems reviewed and are negative.    Allergies  Review of patient's allergies indicates no known allergies.  Home Medications   Current Outpatient Rx   Name  Route  Sig  Dispense  Refill  . cetirizine (ZYRTEC) 10 MG tablet   Oral   Take 10 mg by mouth every morning.         . citalopram (CELEXA) 20 MG tablet   Oral   Take 20 mg by mouth daily. Take with 40mg  dose to equal 60mg  daily.         . citalopram (CELEXA) 40 MG tablet   Oral   Take 40 mg by mouth daily. Take with 20mg  dose to equal 60mg  daily.         . ferrous sulfate 325 (65 FE) MG tablet   Oral   Take 325 mg by mouth 2 (two) times daily.         . traZODone (DESYREL) 150 MG tablet   Oral   Take 150 mg by mouth at bedtime.           BP 155/79  Pulse 83  Resp 19  SpO2 98%  Physical Exam  Nursing note and vitals reviewed. Constitutional: She is oriented to person, place, and time. She appears well-developed and well-nourished. No distress.  HENT:  Head: Normocephalic and atraumatic.  Mouth/Throat: Oropharynx is clear and moist. No oropharyngeal exudate.  Eyes: Conjunctivae and EOM  are normal. Pupils are equal, round, and reactive to light.  Neck: Normal range of motion.  Cardiovascular: Normal rate and regular rhythm.  Exam reveals no gallop and no friction rub.   No murmur heard. Pulmonary/Chest: Effort normal and breath sounds normal. She has no wheezes. She has no rales. She exhibits no tenderness.  Abdominal: Soft. There is no tenderness.  Musculoskeletal: Normal range of motion.  Neurological: She is alert and oriented to person, place, and time. Coordination normal.  Speech is goal-oriented. Moves limbs without ataxia.   Skin: Skin is warm and dry.  Psychiatric: She has a normal mood and affect. Her behavior is normal.    ED Course  Procedures (including critical care time)   Date: 07/16/2012  Rate: 86  Rhythm: normal sinus rhythm  QRS Axis: right  Intervals: normal  ST/T Wave abnormalities: normal  Conduction Disutrbances:none  Narrative Interpretation: NSR unchanged from previous  Old EKG Reviewed: unchanged    Labs Reviewed   BASIC METABOLIC PANEL - Abnormal; Notable for the following:    Glucose, Bld 104 (*)    GFR calc non Af Amer 81 (*)    All other components within normal limits  CBC WITH DIFFERENTIAL  POCT I-STAT TROPONIN I   Dg Chest 2 View  07/16/2012  *RADIOLOGY REPORT*  Clinical Data: Chest pain, cough and headache.  CHEST - 2 VIEW  Comparison: PA and lateral chest 11/08/2011 and 11/04/2010.  CT chest 11/04/2010.  Findings: Lungs are clear.  Heart size upper normal.  No pneumothorax or pleural fluid.  No focal bony abnormality.  IMPRESSION: No acute disease.   Original Report Authenticated By: Holley Dexter, M.D.      1. Chest pain       MDM  9:49 AM Labs, chest xray and EKG unremarkable. Troponin negative. Patient symptoms unlikely due to cardiac etiology. Symptoms likely viral at this time. I will prescribe the patient zyrtec for congestion and delsym for cough. Patient is afebrile with stable vitals. Patient instructed to return with worsening or concerning symptoms.         Emilia Beck, PA-C 07/21/12 1611

## 2012-07-16 NOTE — ED Notes (Signed)
Pt. Ate 100% of lunch.  

## 2012-07-16 NOTE — ED Notes (Signed)
Per EMS pt from home with c/o central chest pain since last night. C/o cough/congestion x 1 week. Reports nausea/vomiting. VSS. IV 20G L arm.

## 2012-07-22 NOTE — ED Provider Notes (Signed)
Medical screening examination/treatment/procedure(s) were performed by non-physician practitioner and as supervising physician I was immediately available for consultation/collaboration.   Saudia Smyser L Randal Goens, MD 07/22/12 0719 

## 2012-07-29 ENCOUNTER — Encounter (HOSPITAL_COMMUNITY): Payer: Self-pay | Admitting: *Deleted

## 2012-07-29 ENCOUNTER — Emergency Department (HOSPITAL_COMMUNITY)
Admission: EM | Admit: 2012-07-29 | Discharge: 2012-07-29 | Discharge status: Against Medical Advice | Payer: Medicare Other | Attending: Emergency Medicine | Admitting: Emergency Medicine

## 2012-07-29 DIAGNOSIS — N938 Other specified abnormal uterine and vaginal bleeding: Secondary | ICD-10-CM | POA: Insufficient documentation

## 2012-07-29 DIAGNOSIS — J45909 Unspecified asthma, uncomplicated: Secondary | ICD-10-CM | POA: Insufficient documentation

## 2012-07-29 DIAGNOSIS — F329 Major depressive disorder, single episode, unspecified: Secondary | ICD-10-CM | POA: Insufficient documentation

## 2012-07-29 DIAGNOSIS — M545 Low back pain, unspecified: Secondary | ICD-10-CM | POA: Insufficient documentation

## 2012-07-29 DIAGNOSIS — N949 Unspecified condition associated with female genital organs and menstrual cycle: Secondary | ICD-10-CM | POA: Insufficient documentation

## 2012-07-29 DIAGNOSIS — R51 Headache: Secondary | ICD-10-CM | POA: Insufficient documentation

## 2012-07-29 DIAGNOSIS — F3289 Other specified depressive episodes: Secondary | ICD-10-CM | POA: Insufficient documentation

## 2012-07-29 DIAGNOSIS — Z8742 Personal history of other diseases of the female genital tract: Secondary | ICD-10-CM | POA: Insufficient documentation

## 2012-07-29 DIAGNOSIS — G4730 Sleep apnea, unspecified: Secondary | ICD-10-CM | POA: Insufficient documentation

## 2012-07-29 LAB — URINALYSIS, ROUTINE W REFLEX MICROSCOPIC
Glucose, UA: NEGATIVE mg/dL
Specific Gravity, Urine: 1.022 (ref 1.005–1.030)
pH: 6 (ref 5.0–8.0)

## 2012-07-29 LAB — COMPREHENSIVE METABOLIC PANEL
AST: 28 U/L (ref 0–37)
Alkaline Phosphatase: 112 U/L (ref 39–117)
BUN: 10 mg/dL (ref 6–23)
CO2: 26 mEq/L (ref 19–32)
Chloride: 100 mEq/L (ref 96–112)
Creatinine, Ser: 0.95 mg/dL (ref 0.50–1.10)
GFR calc non Af Amer: 72 mL/min — ABNORMAL LOW (ref >90)
Total Bilirubin: 0.3 mg/dL (ref 0.3–1.2)

## 2012-07-29 LAB — CBC WITH DIFFERENTIAL/PLATELET
Basophils Absolute: 0 10*3/uL (ref 0.0–0.1)
HCT: 38.1 % (ref 36.0–46.0)
Hemoglobin: 13.4 g/dL (ref 12.0–15.0)
Lymphocytes Relative: 38 % (ref 12–46)
Monocytes Absolute: 0.3 10*3/uL (ref 0.1–1.0)
Monocytes Relative: 4 % (ref 3–12)
Neutro Abs: 3.5 10*3/uL (ref 1.7–7.7)
WBC: 6.3 10*3/uL (ref 4.0–10.5)

## 2012-07-29 LAB — PREGNANCY, URINE: Preg Test, Ur: NEGATIVE

## 2012-07-29 LAB — URINE MICROSCOPIC-ADD ON

## 2012-07-29 NOTE — ED Notes (Signed)
The pt is tired fo waiting the female with the pt reports that he has to go to work and she will have to come back tomorrow.  Leaving now

## 2012-07-29 NOTE — ED Notes (Signed)
The pt is c/o vaginal bleeding for over a month she was seen here on the 23rd of April for the same and other symptoms.  She is also c/o a headache feeling weak lower back pain.  lmp irregular now .  She cannot remember her last period

## 2012-07-29 NOTE — ED Notes (Signed)
NURSE FIRST ROUNDS : PT. SITTING AT WAITING AREA WATCHING TV , RESPIRATIONS UNLABORED , DENIES PAIN AT THIS TIME , NURSE EXPLAINED PROCESS/DELAY .

## 2012-07-30 ENCOUNTER — Emergency Department (HOSPITAL_COMMUNITY)
Admission: EM | Admit: 2012-07-30 | Discharge: 2012-07-30 | Disposition: A | Discharge status: Home / Self Care | Payer: Medicare Other | Attending: Emergency Medicine | Admitting: Emergency Medicine

## 2012-07-30 ENCOUNTER — Encounter (HOSPITAL_COMMUNITY): Payer: Self-pay

## 2012-07-30 DIAGNOSIS — J45909 Unspecified asthma, uncomplicated: Secondary | ICD-10-CM | POA: Insufficient documentation

## 2012-07-30 DIAGNOSIS — Z79899 Other long term (current) drug therapy: Secondary | ICD-10-CM | POA: Insufficient documentation

## 2012-07-30 DIAGNOSIS — Z8619 Personal history of other infectious and parasitic diseases: Secondary | ICD-10-CM | POA: Insufficient documentation

## 2012-07-30 DIAGNOSIS — Z8739 Personal history of other diseases of the musculoskeletal system and connective tissue: Secondary | ICD-10-CM | POA: Insufficient documentation

## 2012-07-30 DIAGNOSIS — N939 Abnormal uterine and vaginal bleeding, unspecified: Secondary | ICD-10-CM

## 2012-07-30 DIAGNOSIS — R51 Headache: Secondary | ICD-10-CM | POA: Insufficient documentation

## 2012-07-30 DIAGNOSIS — F329 Major depressive disorder, single episode, unspecified: Secondary | ICD-10-CM | POA: Insufficient documentation

## 2012-07-30 DIAGNOSIS — N898 Other specified noninflammatory disorders of vagina: Secondary | ICD-10-CM | POA: Insufficient documentation

## 2012-07-30 DIAGNOSIS — Z8742 Personal history of other diseases of the female genital tract: Secondary | ICD-10-CM | POA: Insufficient documentation

## 2012-07-30 DIAGNOSIS — F1910 Other psychoactive substance abuse, uncomplicated: Secondary | ICD-10-CM | POA: Insufficient documentation

## 2012-07-30 DIAGNOSIS — Z3202 Encounter for pregnancy test, result negative: Secondary | ICD-10-CM | POA: Insufficient documentation

## 2012-07-30 DIAGNOSIS — F3289 Other specified depressive episodes: Secondary | ICD-10-CM | POA: Insufficient documentation

## 2012-07-30 LAB — WET PREP, GENITAL
Trich, Wet Prep: NONE SEEN
WBC, Wet Prep HPF POC: NONE SEEN

## 2012-07-30 LAB — POCT I-STAT, CHEM 8
BUN: 14 mg/dL (ref 6–23)
Calcium, Ion: 1.2 mmol/L (ref 1.12–1.23)
Calcium, Ion: 1.26 mmol/L — ABNORMAL HIGH (ref 1.12–1.23)
Chloride: 104 mEq/L (ref 96–112)
Creatinine, Ser: 1.1 mg/dL (ref 0.50–1.10)
Creatinine, Ser: 1 mg/dL (ref 0.50–1.10)
Glucose, Bld: 96 mg/dL (ref 70–99)
HCT: 42 % (ref 36.0–46.0)
Hemoglobin: 14.3 g/dL (ref 12.0–15.0)
TCO2: 26 mmol/L (ref 0–100)
TCO2: 28 mmol/L (ref 0–100)

## 2012-07-30 LAB — URINALYSIS, ROUTINE W REFLEX MICROSCOPIC
Bilirubin Urine: NEGATIVE
Ketones, ur: NEGATIVE mg/dL
Nitrite: NEGATIVE
Protein, ur: NEGATIVE mg/dL
Specific Gravity, Urine: 1.016 (ref 1.005–1.030)
Urobilinogen, UA: 0.2 mg/dL (ref 0.0–1.0)

## 2012-07-30 LAB — POCT PREGNANCY, URINE: Preg Test, Ur: NEGATIVE

## 2012-07-30 LAB — GC/CHLAMYDIA PROBE AMP: GC Probe RNA: NEGATIVE

## 2012-07-30 MED ORDER — DIPHENHYDRAMINE HCL 50 MG/ML IJ SOLN
25.0000 mg | Freq: Once | INTRAMUSCULAR | Status: AC
  Administered 2012-07-30: 25 mg via INTRAMUSCULAR
  Filled 2012-07-30: qty 1

## 2012-07-30 MED ORDER — KETOROLAC TROMETHAMINE 30 MG/ML IJ SOLN
30.0000 mg | Freq: Once | INTRAMUSCULAR | Status: AC
  Administered 2012-07-30: 30 mg via INTRAMUSCULAR
  Filled 2012-07-30: qty 1

## 2012-07-30 MED ORDER — DEXAMETHASONE SODIUM PHOSPHATE 10 MG/ML IJ SOLN
10.0000 mg | Freq: Once | INTRAMUSCULAR | Status: AC
  Administered 2012-07-30: 10 mg via INTRAMUSCULAR
  Filled 2012-07-30: qty 1

## 2012-07-30 MED ORDER — METOCLOPRAMIDE HCL 5 MG/ML IJ SOLN
10.0000 mg | Freq: Once | INTRAMUSCULAR | Status: AC
  Administered 2012-07-30: 10 mg via INTRAMUSCULAR
  Filled 2012-07-30: qty 2

## 2012-07-30 NOTE — ED Notes (Signed)
Pt states she is here for headache and has had vaginal bleed since 4/23.  When asking pt about visit yesterday to [facility], pt denies and says she doesn't remember that.  Also c/o cough for 3 days.  Dry cough

## 2012-07-30 NOTE — ED Provider Notes (Signed)
History     CSN: [id number]  Arrival date & time 07/30/12  0025   First MD Initiated Contact with Patient 07/30/12 0309      Chief Complaint  Patient presents with  . Headache  . Vaginal Bleeding   HPI  History provided by the patient. He is a 47 year old female with history of polysubstance abuse, depression, headaches and asthma who presents with complaints of continued vaginal bleeding and headache. Patient states she's had vaginal bleeding since April 23. Bleeding has been persistent. Patient has used multiple pads every day. She reports using a whole packet to 47 pads since bleeding began. Patient also has occasional lower pelvic cramps and pains. These are brief and intermittent. She denies any vaginal discharge. Denies any dysuria or urinary frequency. Patient has also had some associated headache that began earlier yesterday. Headache has been worsening and located in the posterior scalp area. It radiates some around both sides of the head. He denies any aggravating or alleviating factors. She has taken ibuprofen and Aleve without any improvement. Denies any other associated symptoms. No fever, chills or sweats. No nausea, vomiting.   Past Medical History  Diagnosis Date  . Asthma   . Sleep apnea   . Headache   . Depression     on meds  . Drug abuse 2002  . HSV (herpes simplex virus) infection   . Ovarian cyst   . Muscle spasms of head or neck     Past Surgical History  Procedure Laterality Date  . Cesarean section    . Tubal ligation      Family History  Problem Relation Age of Onset  . Anesthesia problems Neg Hx   . Hypotension Neg Hx   . Malignant hyperthermia Neg Hx   . Pseudochol deficiency Neg Hx   . Diabetes Mother   . Alcohol abuse Father   . Liver disease Father     History  Substance Use Topics  . Smoking status: Never Smoker   . Smokeless tobacco: Never Used  . Alcohol Use: No    OB History   Grav Para Term Preterm Abortions TAB SAB Ect Mult  Living   5 5 5  0 0 0 0 0 0 5      Review of Systems  Constitutional: Negative for fever, chills and diaphoresis.  Eyes: Negative for visual disturbance.  Gastrointestinal: Negative for nausea, vomiting and abdominal pain.  Genitourinary: Positive for vaginal bleeding. Negative for dysuria, frequency, flank pain, vaginal discharge and vaginal pain.  Neurological: Positive for headaches.  All other systems reviewed and are negative.    Allergies  Review of patient's allergies indicates no known allergies.  Home Medications   Current Outpatient Rx  Name  Route  Sig  Dispense  Refill  . cetirizine (ZYRTEC) 10 MG tablet   Oral   Take 10 mg by mouth every morning.         . citalopram (CELEXA) 20 MG tablet   Oral   Take 60 mg by mouth daily. Take with 40mg  dose to equal 60mg  daily.         . ferrous sulfate 325 (65 FE) MG tablet   Oral   Take 325 mg by mouth 2 (two) times daily.           BP 146/106  Pulse 90  Temp(Src) 98.5 F (36.9 C) (Oral)  Resp 20  SpO2 98%  LMP 07/29/2012  Physical Exam  Nursing note and vitals reviewed. Constitutional: She is  oriented to person, place, and time. She appears well-developed and well-nourished. No distress.  HENT:  Head: Normocephalic.  Eyes: Conjunctivae and EOM are normal. Pupils are equal, round, and reactive to light.  Cardiovascular: Normal rate and regular rhythm.   Pulmonary/Chest: Effort normal and breath sounds normal. No respiratory distress.  Abdominal: Soft. There is tenderness in the suprapubic area. There is no rebound and no guarding.  Genitourinary:  Chaperone was present. Mild to moderate blood in the vaginal vault. No adnexal tenderness. No appreciable masses. Exam is somewhat limited due to body habitus.  Neurological: She is alert and oriented to person, place, and time. She has normal strength. No cranial nerve deficit or sensory deficit. Gait normal.  Skin: Skin is warm and dry. No rash noted.   Psychiatric: She has a normal mood and affect. Her behavior is normal.    ED Course  Procedures     Results for orders placed during the hospital encounter of 07/30/12  WET PREP, GENITAL      Result Value Range   Yeast Wet Prep HPF POC NONE SEEN  NONE SEEN   Trich, Wet Prep NONE SEEN  NONE SEEN   Clue Cells Wet Prep HPF POC MODERATE (*) NONE SEEN   WBC, Wet Prep HPF POC NONE SEEN  NONE SEEN  URINALYSIS, ROUTINE W REFLEX MICROSCOPIC      Result Value Range   Color, Urine YELLOW  YELLOW   APPearance CLEAR  CLEAR   Specific Gravity, Urine 1.016  1.005 - 1.030   pH 5.5  5.0 - 8.0   Glucose, UA NEGATIVE  NEGATIVE mg/dL   Hgb urine dipstick LARGE (*) NEGATIVE   Bilirubin Urine NEGATIVE  NEGATIVE   Ketones, ur NEGATIVE  NEGATIVE mg/dL   Protein, ur NEGATIVE  NEGATIVE mg/dL   Urobilinogen, UA 0.2  0.0 - 1.0 mg/dL   Nitrite NEGATIVE  NEGATIVE   Leukocytes, UA NEGATIVE  NEGATIVE  URINE MICROSCOPIC-ADD ON      Result Value Range   Squamous Epithelial / LPF RARE  RARE   WBC, UA 0-2  <3 WBC/hpf   RBC / HPF TOO NUMEROUS TO COUNT  <3 RBC/hpf  POCT I-STAT, CHEM 8      Result Value Range   Sodium 138  135 - 145 mEq/L   Potassium 3.9  3.5 - 5.1 mEq/L   Chloride 104  96 - 112 mEq/L   BUN 13  6 - 23 mg/dL   Creatinine, Ser 4.54  0.50 - 1.10 mg/dL   Glucose, Bld 96  70 - 99 mg/dL   Calcium, Ion 0.98  1.12 - 1.23 mmol/L   TCO2 26  0 - 100 mmol/L   Hemoglobin 14.3  12.0 - 15.0 g/dL   HCT 11.9  14.7 - 82.9 %   Point-of-care pregnancy test negative    1. Abnormal vaginal bleeding       MDM  Patient seen and evaluated. Patient appears well in no acute distress.        Angus Seller, PA-C 07/30/12 [phone]

## 2012-07-30 NOTE — ED Provider Notes (Signed)
Medical screening examination/treatment/procedure(s) were performed by non-physician practitioner and as supervising physician I was immediately available for consultation/collaboration.  Olivia Mackie, MD 07/30/12 [phone]

## 2012-08-04 ENCOUNTER — Emergency Department (HOSPITAL_COMMUNITY)
Admission: EM | Admit: 2012-08-04 | Discharge: 2012-08-04 | Disposition: A | Discharge status: Home / Self Care | Payer: Medicare Other | Attending: Emergency Medicine | Admitting: Emergency Medicine

## 2012-08-04 ENCOUNTER — Encounter (HOSPITAL_COMMUNITY): Payer: Self-pay | Admitting: Emergency Medicine

## 2012-08-04 DIAGNOSIS — Z8742 Personal history of other diseases of the female genital tract: Secondary | ICD-10-CM | POA: Insufficient documentation

## 2012-08-04 DIAGNOSIS — Z79899 Other long term (current) drug therapy: Secondary | ICD-10-CM | POA: Insufficient documentation

## 2012-08-04 DIAGNOSIS — R51 Headache: Secondary | ICD-10-CM | POA: Insufficient documentation

## 2012-08-04 DIAGNOSIS — IMO0002 Reserved for concepts with insufficient information to code with codable children: Secondary | ICD-10-CM | POA: Insufficient documentation

## 2012-08-04 DIAGNOSIS — S39012A Strain of muscle, fascia and tendon of lower back, initial encounter: Secondary | ICD-10-CM

## 2012-08-04 DIAGNOSIS — F329 Major depressive disorder, single episode, unspecified: Secondary | ICD-10-CM | POA: Insufficient documentation

## 2012-08-04 DIAGNOSIS — Z8619 Personal history of other infectious and parasitic diseases: Secondary | ICD-10-CM | POA: Insufficient documentation

## 2012-08-04 DIAGNOSIS — Y9389 Activity, other specified: Secondary | ICD-10-CM | POA: Insufficient documentation

## 2012-08-04 DIAGNOSIS — Y929 Unspecified place or not applicable: Secondary | ICD-10-CM | POA: Insufficient documentation

## 2012-08-04 DIAGNOSIS — Z8669 Personal history of other diseases of the nervous system and sense organs: Secondary | ICD-10-CM | POA: Insufficient documentation

## 2012-08-04 DIAGNOSIS — F3289 Other specified depressive episodes: Secondary | ICD-10-CM | POA: Insufficient documentation

## 2012-08-04 DIAGNOSIS — X500XXA Overexertion from strenuous movement or load, initial encounter: Secondary | ICD-10-CM | POA: Insufficient documentation

## 2012-08-04 DIAGNOSIS — Z8739 Personal history of other diseases of the musculoskeletal system and connective tissue: Secondary | ICD-10-CM | POA: Insufficient documentation

## 2012-08-04 DIAGNOSIS — J45909 Unspecified asthma, uncomplicated: Secondary | ICD-10-CM | POA: Insufficient documentation

## 2012-08-04 MED ORDER — NAPROXEN 500 MG PO TABS: 500.0000 mg | ORAL_TABLET | Freq: Two times a day (BID) | ORAL | Status: DC

## 2012-08-04 MED ORDER — CYCLOBENZAPRINE HCL 10 MG PO TABS: 10.0000 mg | ORAL_TABLET | Freq: Two times a day (BID) | ORAL | Status: DC | PRN

## 2012-08-04 MED ORDER — HYDROCODONE-ACETAMINOPHEN 5-325 MG PO TABS
1.0000 | ORAL_TABLET | Freq: Once | ORAL | Status: AC
  Administered 2012-08-04: 1 via ORAL
  Filled 2012-08-04: qty 1

## 2012-08-04 NOTE — ED Notes (Signed)
Back pain since early this morning, states she was in bed asleep and when tried to get up had acute back pain, denies recent injury, pain is in left side in shoulder radiates down back and down leg

## 2012-08-04 NOTE — ED Provider Notes (Signed)
History     CSN: [id number]  Arrival date & time 08/04/12  1047   First MD Initiated Contact with Patient 08/04/12 1236      No chief complaint on file.   (Consider location/radiation/quality/duration/timing/severity/associated sxs/prior treatment) HPI Tiffany Blake is a 47 y.o. female who presents to ED with complaint of back pain onset this morning when tried to get out of bed. States pain in upper back, radiating down lower back, mainly on left side.  Denies numbness or weakness in extremities. No  Abdominal or chest pain. No n/v. No flank pain. States also having a headache. States she has never had a headache before. States pain is frontal. Not worsened with anything. Did not try taking any medications for this. Denies fever, chills, neck pain or stiffness. No other complaints.    Past Medical History  Diagnosis Date  . Asthma   . Sleep apnea   . Headache   . Depression     on meds  . Drug abuse 2002  . HSV (herpes simplex virus) infection   . Ovarian cyst   . Muscle spasms of head or neck     Past Surgical History  Procedure Laterality Date  . Cesarean section    . Tubal ligation      Family History  Problem Relation Age of Onset  . Anesthesia problems Neg Hx   . Hypotension Neg Hx   . Malignant hyperthermia Neg Hx   . Pseudochol deficiency Neg Hx   . Diabetes Mother   . Alcohol abuse Father   . Liver disease Father     History  Substance Use Topics  . Smoking status: Never Smoker   . Smokeless tobacco: Never Used  . Alcohol Use: No    OB History   Grav Para Term Preterm Abortions TAB SAB Ect Mult Living   5 5 5  0 0 0 0 0 0 5      Review of Systems  Constitutional: Negative for fever and chills.  HENT: Negative for neck pain and neck stiffness.   Respiratory: Negative.   Cardiovascular: Negative.   Musculoskeletal: Positive for back pain.  Neurological: Positive for headaches.  All other systems reviewed and are negative.    Allergies   Review of patient's allergies indicates no known allergies.  Home Medications   Current Outpatient Rx  Name  Route  Sig  Dispense  Refill  . cetirizine (ZYRTEC) 10 MG tablet   Oral   Take 10 mg by mouth every morning.         . citalopram (CELEXA) 20 MG tablet   Oral   Take 60 mg by mouth daily. Take with 40mg  dose to equal 60mg  daily.         . ferrous sulfate 325 (65 FE) MG tablet   Oral   Take 325 mg by mouth 2 (two) times daily.           BP 150/70  Temp(Src) 98.2 F (36.8 C) (Oral)  Resp 20  SpO2 99%  LMP 07/29/2012  Physical Exam  Nursing note and vitals reviewed. Constitutional: She is oriented to person, place, and time. She appears well-developed and well-nourished. No distress.  Morbidly obese  HENT:  Head: Normocephalic.  Eyes: Conjunctivae and EOM are normal. Pupils are equal, round, and reactive to light.  Neck: Normal range of motion. Neck supple.  Cardiovascular: Normal rate, regular rhythm and normal heart sounds.   Pulmonary/Chest: Effort normal and breath sounds normal. No respiratory distress.  She has no wheezes. She has no rales.  Musculoskeletal: She exhibits no edema.  Tender to palpation from left trapezius down over left scapular area and into left lower paravertebral muscles. Tender with light touch, even when just brushing over skin. No rash noted. Pain with left shoulder rom, strength is normal and equal bilaterally with shoulder flexion, extension, bicep and tricep flexion and extension. LE strength normal and equal bilaterally. Gait normal.   Neurological: She is alert and oriented to person, place, and time. No cranial nerve deficit. Coordination normal.  Skin: Skin is warm and dry.    ED Course  Procedures (including critical care time)  Labs Reviewed - No data to display No results found.   1. Back strain, initial encounter   2. Headache       MDM  Pt with back pain and headache. Onset of back pain today, did not try  anything for it ag home. Has hx of muscle spasms and drug abuse. No neuro deficits on exam. States never had a headache before, however, she was here for a headache on her last visit. Suspect tension heache and muscle spasms. No signs of cauda equina. No meningismus. Will treat with naprosyn, flexeril, follow up.   Filed Vitals:   08/04/12 1133 08/04/12 1335  BP: 150/70 139/54  Temp: 98.2 F (36.8 C)   TempSrc: Oral   Resp: 20   SpO2: 99%            Ravenna Legore A Jehu Mccauslin, PA-C 08/04/12 1700

## 2012-08-07 NOTE — ED Provider Notes (Signed)
Medical screening examination/treatment/procedure(s) were performed by non-physician practitioner and as supervising physician I was immediately available for consultation/collaboration.   Chen Holzman, MD 08/07/12 1535 

## 2013-01-29 ENCOUNTER — Other Ambulatory Visit: Payer: Self-pay

## 2013-03-27 ENCOUNTER — Emergency Department (HOSPITAL_COMMUNITY)
Admission: EM | Admit: 2013-03-27 | Discharge: 2013-03-27 | Disposition: A | Discharge status: Home / Self Care | Payer: Medicare Other | Attending: Emergency Medicine | Admitting: Emergency Medicine

## 2013-03-27 ENCOUNTER — Emergency Department (HOSPITAL_COMMUNITY): Payer: Medicare Other

## 2013-03-27 ENCOUNTER — Encounter (HOSPITAL_COMMUNITY): Payer: Self-pay | Admitting: Emergency Medicine

## 2013-03-27 DIAGNOSIS — Z8619 Personal history of other infectious and parasitic diseases: Secondary | ICD-10-CM | POA: Insufficient documentation

## 2013-03-27 DIAGNOSIS — R69 Illness, unspecified: Secondary | ICD-10-CM

## 2013-03-27 DIAGNOSIS — F3289 Other specified depressive episodes: Secondary | ICD-10-CM | POA: Insufficient documentation

## 2013-03-27 DIAGNOSIS — E669 Obesity, unspecified: Secondary | ICD-10-CM | POA: Insufficient documentation

## 2013-03-27 DIAGNOSIS — N912 Amenorrhea, unspecified: Secondary | ICD-10-CM | POA: Insufficient documentation

## 2013-03-27 DIAGNOSIS — Z8742 Personal history of other diseases of the female genital tract: Secondary | ICD-10-CM | POA: Insufficient documentation

## 2013-03-27 DIAGNOSIS — Z791 Long term (current) use of non-steroidal anti-inflammatories (NSAID): Secondary | ICD-10-CM | POA: Insufficient documentation

## 2013-03-27 DIAGNOSIS — Z79899 Other long term (current) drug therapy: Secondary | ICD-10-CM | POA: Insufficient documentation

## 2013-03-27 DIAGNOSIS — Z8739 Personal history of other diseases of the musculoskeletal system and connective tissue: Secondary | ICD-10-CM | POA: Insufficient documentation

## 2013-03-27 DIAGNOSIS — J45909 Unspecified asthma, uncomplicated: Secondary | ICD-10-CM | POA: Insufficient documentation

## 2013-03-27 DIAGNOSIS — F329 Major depressive disorder, single episode, unspecified: Secondary | ICD-10-CM | POA: Insufficient documentation

## 2013-03-27 DIAGNOSIS — J111 Influenza due to unidentified influenza virus with other respiratory manifestations: Secondary | ICD-10-CM | POA: Insufficient documentation

## 2013-03-27 LAB — CBC
HEMATOCRIT: 36.6 % (ref 36.0–46.0)
HEMOGLOBIN: 11.8 g/dL — AB (ref 12.0–15.0)
MCH: 28.3 pg (ref 26.0–34.0)
MCHC: 32.2 g/dL (ref 30.0–36.0)
MCV: 87.8 fL (ref 78.0–100.0)
Platelets: 206 10*3/uL (ref 150–400)
RBC: 4.17 MIL/uL (ref 3.87–5.11)
RDW: 12.9 % (ref 11.5–15.5)
WBC: 6.1 10*3/uL (ref 4.0–10.5)

## 2013-03-27 LAB — BASIC METABOLIC PANEL
BUN: 11 mg/dL (ref 6–23)
CO2: 26 meq/L (ref 19–32)
Calcium: 9 mg/dL (ref 8.4–10.5)
Chloride: 101 mEq/L (ref 96–112)
Creatinine, Ser: 0.74 mg/dL (ref 0.50–1.10)
GFR calc Af Amer: >90 mL/min (ref >90)
GLUCOSE: 106 mg/dL — AB (ref 70–99)
Potassium: 4.2 mEq/L (ref 3.7–5.3)
SODIUM: 140 meq/L (ref 137–147)

## 2013-03-27 MED ORDER — HYDROCODONE-ACETAMINOPHEN 5-325 MG PO TABS
2.0000 | ORAL_TABLET | Freq: Once | ORAL | Status: AC
  Administered 2013-03-27: 2 via ORAL
  Filled 2013-03-27: qty 2

## 2013-03-27 MED ORDER — AEROCHAMBER PLUS W/MASK MISC
1.0000 | Freq: Once | Status: AC
  Administered 2013-03-27: 1
  Filled 2013-03-27: qty 1

## 2013-03-27 MED ORDER — ALBUTEROL SULFATE HFA 108 (90 BASE) MCG/ACT IN AERS
2.0000 | INHALATION_SPRAY | RESPIRATORY_TRACT | Status: DC | PRN
  Administered 2013-03-27: 2 via RESPIRATORY_TRACT
  Filled 2013-03-27: qty 6.7

## 2013-03-27 MED ORDER — HYDROCODONE-ACETAMINOPHEN 5-325 MG PO TABS: 1.0000 | ORAL_TABLET | Freq: Four times a day (QID) | ORAL | Status: DC | PRN

## 2013-03-27 NOTE — ED Notes (Signed)
Pt c/o CP to left upper chest, that started yesterday, come when she coughs. Also c/o HA and cough that all started yesterday too. Denies productive cough. sts she was having some chills at home, didn't check temp. Nad, skin warm and dry, resp e/u.

## 2013-03-27 NOTE — ED Notes (Signed)
Pt states shes had a dry cough , headaches and chest pain since yesterday.

## 2013-03-27 NOTE — ED Notes (Signed)
Pt returned from radiology.

## 2013-03-27 NOTE — Discharge Instructions (Signed)
Influenza, Adult Take Tylenol for mild pain or the pain medicine prescribed for bad pain. The pain medicine prescribed also help your cough. Use your inhaler 2 puffs every 4 hours as needed for cough or shortness of breath. See your doctor if not improved in a week. Return for worsening breathing or if your condition worsens for any reason. Influenza ("the flu") is a viral infection of the respiratory tract. It occurs more often in winter months because people spend more time in close contact with one another. Influenza can make you feel very sick. Influenza easily spreads from person to person (contagious). CAUSES  Influenza is caused by a virus that infects the respiratory tract. You can catch the virus by breathing in droplets from an infected person's cough or sneeze. You can also catch the virus by touching something that was recently contaminated with the virus and then touching your mouth, nose, or eyes. SYMPTOMS  Symptoms typically last 4 to 10 days and may include:  Fever.  Chills.  Headache, body aches, and muscle aches.  Sore throat.  Chest discomfort and cough.  Poor appetite.  Weakness or feeling tired.  Dizziness.  Nausea or vomiting. DIAGNOSIS  Diagnosis of influenza is often made based on your history and a physical exam. A nose or throat swab test can be done to confirm the diagnosis. RISKS AND COMPLICATIONS You may be at risk for a more severe case of influenza if you smoke cigarettes, have diabetes, have chronic heart disease (such as heart failure) or lung disease (such as asthma), or if you have a weakened immune system. Elderly people and pregnant women are also at risk for more serious infections. The most common complication of influenza is a lung infection (pneumonia). Sometimes, this complication can require emergency medical care and may be life-threatening. PREVENTION  An annual influenza vaccination (flu shot) is the best way to avoid getting influenza. An  annual flu shot is now routinely recommended for all adults in the [place] TREATMENT  In mild cases, influenza goes away on its own. Treatment is directed at relieving symptoms. For more severe cases, your caregiver may prescribe antiviral medicines to shorten the sickness. Antibiotic medicines are not effective, because the infection is caused by a virus, not by bacteria. HOME CARE INSTRUCTIONS  Only take over-the-counter or prescription medicines for pain, discomfort, or fever as directed by your caregiver.  Use a cool mist humidifier to make breathing easier.  Get plenty of rest until your temperature returns to normal. This usually takes 3 to 4 days.  Drink enough fluids to keep your urine clear or pale yellow.  Cover your mouth and nose when coughing or sneezing, and wash your hands well to avoid spreading the virus.  Stay home from work or school until your fever has been gone for at least 1 full day. SEEK MEDICAL CARE IF:   You have chest pain or a deep cough that worsens or produces more mucus.  You have nausea, vomiting, or diarrhea. SEEK IMMEDIATE MEDICAL CARE IF:   You have difficulty breathing, shortness of breath, or your skin or nails turn bluish.  You have severe neck pain or stiffness.  You have a severe headache, facial pain, or earache.  You have a worsening or recurring fever.  You have nausea or vomiting that cannot be controlled. MAKE SURE YOU:  Understand these instructions.  Will watch your condition.  Will get help right away if you are not doing well or get worse. Document Released:  03/09/2000 Document Revised: 09/11/2011 Document Reviewed: 06/11/2011 Exit[employer] Patient Information 2014 [place].

## 2013-03-27 NOTE — ED Notes (Signed)
Pt in radiology 

## 2013-03-27 NOTE — ED Provider Notes (Signed)
CSN: [id number]     Arrival date & time 03/27/13  1554 History   First MD Initiated Contact with Patient 03/27/13 1649     Chief Complaint  Patient presents with  . Cough   (Consider location/radiation/quality/duration/timing/severity/associated sxs/prior Treatment) Patient is a 48 y.o. female presenting with cough.  Cough Associated symptoms: fever, myalgias and sore throat   Associated symptoms: no shortness of breath    Presents with cough, headache, sore throat and subjective fever diffuse myalgias onset 2 days ago. No treatment prior to coming here. Sore throat is worse when she coughs or swallows. Nothing makes symptoms better. No other associated symptoms. Past Medical History  Diagnosis Date  . Asthma   . Sleep apnea   . Headache(784.0)   . Depression     on meds  . Drug abuse 2002  . HSV (herpes simplex virus) infection   . Ovarian cyst   . Muscle spasms of head or neck    Past Surgical History  Procedure Laterality Date  . Cesarean section    . Tubal ligation     uterine ablation Family History  Problem Relation Age of Onset  . Anesthesia problems Neg Hx   . Hypotension Neg Hx   . Malignant hyperthermia Neg Hx   . Pseudochol deficiency Neg Hx   . Diabetes Mother   . Alcohol abuse Father   . Liver disease Father    History  Substance Use Topics  . Smoking status: Never Smoker   . Smokeless tobacco: Never Used  . Alcohol Use: No   OB History   Grav Para Term Preterm Abortions TAB SAB Ect Mult Living   5 5 5  0 0 0 0 0 0 5     Review of Systems  Constitutional: Positive for fever.  HENT: Positive for congestion and sore throat.   Respiratory: Positive for cough. Negative for shortness of breath.   Genitourinary:       Amenorrhea  Musculoskeletal: Positive for myalgias.  All other systems reviewed and are negative.    Allergies  Review of patient's allergies indicates no known allergies.  Home Medications   Current Outpatient Rx  Name  Route   Sig  Dispense  Refill  . cetirizine (ZYRTEC) 10 MG tablet   Oral   Take 10 mg by mouth every morning.         . citalopram (CELEXA) 20 MG tablet   Oral   Take 60 mg by mouth daily. Take with 40mg  dose to equal 60mg  daily.         . cyclobenzaprine (FLEXERIL) 10 MG tablet   Oral   Take 1 tablet (10 mg total) by mouth 2 (two) times daily as needed for muscle spasms.   20 tablet   0   . ferrous sulfate 325 (65 FE) MG tablet   Oral   Take 325 mg by mouth 2 (two) times daily.         . naproxen (NAPROSYN) 500 MG tablet   Oral   Take 1 tablet (500 mg total) by mouth 2 (two) times daily.   30 tablet   0    BP 128/88  Pulse 98  Temp(Src) 98.8 F (37.1 C) (Axillary)  Resp 22  SpO2 98% Physical Exam  Nursing note and vitals reviewed. Constitutional: She appears well-developed and well-nourished. No distress.  HENT:  Head: Normocephalic and atraumatic.  Mouth/Throat: No oropharyngeal exudate.  Oropharynx reddened. Uvula midline  Eyes: Conjunctivae are normal. Pupils are equal,  round, and reactive to light.  Neck: Neck supple. No tracheal deviation present. No thyromegaly present.  Cardiovascular: Normal rate and regular rhythm.   No murmur heard. Pulmonary/Chest: Effort normal and breath sounds normal.  Coughing frequently. Speaks in paragraphs. No respiratory distress  Abdominal: Soft. Bowel sounds are normal. She exhibits no distension. There is no tenderness.  Obese  Musculoskeletal: Normal range of motion. She exhibits no edema and no tenderness.  Neurological: She is alert. Coordination normal.  Skin: Skin is warm and dry. No rash noted.  Psychiatric: She has a normal mood and affect.    ED Course  Procedures (including critical care time) Labs Review Labs Reviewed - No data to display Imaging Review Dg Chest 2 View  03/27/2013   CLINICAL DATA:  Cough.  Asthma.  EXAM: CHEST  2 VIEW  COMPARISON:  07/16/2012  FINDINGS: Heart size is normal. Mediastinal shadows  are normal. There is central bronchial thickening with there is no infiltrate, mass, effusion or collapse. No bony abnormality.  IMPRESSION: Bronchial thickening.  No consolidation or collapse.   Electronically Signed   By: Nelson Chimes M.D.   On: 03/27/2013 16:51    EKG Interpretation   None      chest x-ray viewed by me  MDM  No diagnosis found. Symptoms and exam consistent with influenza-like illness Plan albuterol HFA with spacer to take 2 puffs every 4 hours when necessary cough or shortness of breath Prescription Norco Followup PMD if not better in 7-10 days Diagnosis influenza-like illness    Orlie Dakin, MD 03/28/13 [phone]

## 2013-03-27 NOTE — ED Notes (Signed)
Dr. Winfred Leeds requests cancelling of labs.  Pt to have breathing treatment and po pain med and will be d.c after.  Pt continues to have coughing.  Isolation in place

## 2013-04-15 ENCOUNTER — Emergency Department (HOSPITAL_COMMUNITY)
Admission: EM | Admit: 2013-04-15 | Discharge: 2013-04-15 | Disposition: A | Discharge status: Home / Self Care | Payer: Medicare Other | Attending: Emergency Medicine | Admitting: Emergency Medicine

## 2013-04-15 ENCOUNTER — Encounter (HOSPITAL_COMMUNITY): Payer: Self-pay | Admitting: Emergency Medicine

## 2013-04-15 DIAGNOSIS — Z8619 Personal history of other infectious and parasitic diseases: Secondary | ICD-10-CM | POA: Insufficient documentation

## 2013-04-15 DIAGNOSIS — Z8742 Personal history of other diseases of the female genital tract: Secondary | ICD-10-CM | POA: Insufficient documentation

## 2013-04-15 DIAGNOSIS — M25512 Pain in left shoulder: Secondary | ICD-10-CM

## 2013-04-15 DIAGNOSIS — J45909 Unspecified asthma, uncomplicated: Secondary | ICD-10-CM | POA: Insufficient documentation

## 2013-04-15 DIAGNOSIS — M546 Pain in thoracic spine: Secondary | ICD-10-CM | POA: Insufficient documentation

## 2013-04-15 DIAGNOSIS — F3289 Other specified depressive episodes: Secondary | ICD-10-CM | POA: Insufficient documentation

## 2013-04-15 DIAGNOSIS — F329 Major depressive disorder, single episode, unspecified: Secondary | ICD-10-CM | POA: Insufficient documentation

## 2013-04-15 DIAGNOSIS — M25519 Pain in unspecified shoulder: Secondary | ICD-10-CM | POA: Insufficient documentation

## 2013-04-15 DIAGNOSIS — Z79899 Other long term (current) drug therapy: Secondary | ICD-10-CM | POA: Insufficient documentation

## 2013-04-15 MED ORDER — TRAMADOL HCL 50 MG PO TABS: 50.0000 mg | ORAL_TABLET | Freq: Four times a day (QID) | ORAL | Status: DC | PRN

## 2013-04-15 MED ORDER — NAPROXEN 500 MG PO TABS: 500.0000 mg | ORAL_TABLET | Freq: Two times a day (BID) | ORAL | Status: DC

## 2013-04-15 MED ORDER — METHOCARBAMOL 500 MG PO TABS: 500.0000 mg | ORAL_TABLET | Freq: Two times a day (BID) | ORAL | Status: DC

## 2013-04-15 MED ORDER — NAPROXEN 250 MG PO TABS
500.0000 mg | ORAL_TABLET | Freq: Once | ORAL | Status: AC
Start: 2013-04-15 — End: 2013-04-15
  Administered 2013-04-15: 500 mg via ORAL
  Filled 2013-04-15: qty 2

## 2013-04-15 NOTE — ED Provider Notes (Signed)
CSN: [id number]     Arrival date & time 04/15/13  0919 History  This chart was scribed for non-physician practitioner Hyman Bible, PA-C, working with Kathalene Frames, MD by Zettie Pho, ED Scribe. This patient was seen in room TR05C/TR05C and the patient's care was started at 10:02 AM.    Chief Complaint  Patient presents with  . Shoulder Pain   The history is provided by the patient. No language interpreter was used.   HPI Comments: Tiffany Blake is a 48 y.o. female who presents to the Emergency Department complaining of a constant, sore pain to the left shoulder and left upper back onset about a week ago after bowling. She states that the pain has been progressively worsening and is exacerbated with movement. She denies any potential injury or trauma to the area. Patient is right-handed, but states she was using her left hand to pick up the bowling ball. She reports taking Advil at home, last dose was earlier today, without significant relief. She denies chest pain, shortness of breath, numbness or tingling. Patient has a history of muscle spasms of the head and neck, asthma, and drug abuse.   Past Medical History  Diagnosis Date  . Asthma   . Sleep apnea   . Headache(784.0)   . Depression     on meds  . Drug abuse 2002  . HSV (herpes simplex virus) infection   . Ovarian cyst   . Muscle spasms of head or neck    Past Surgical History  Procedure Laterality Date  . Cesarean section    . Tubal ligation     Family History  Problem Relation Age of Onset  . Anesthesia problems Neg Hx   . Hypotension Neg Hx   . Malignant hyperthermia Neg Hx   . Pseudochol deficiency Neg Hx   . Diabetes Mother   . Alcohol abuse Father   . Liver disease Father    History  Substance Use Topics  . Smoking status: Never Smoker   . Smokeless tobacco: Never Used  . Alcohol Use: No   OB History   Grav Para Term Preterm Abortions TAB SAB Ect Mult Living   5 5 5  0 0 0 0 0 0 5     Review of  Systems  A complete 10 system review of systems was obtained and all systems are negative except as noted in the HPI and PMH.   Allergies  Review of patient's allergies indicates no known allergies.  Home Medications   Current Outpatient Rx  Name  Route  Sig  Dispense  Refill  . cetirizine (ZYRTEC) 10 MG tablet   Oral   Take 10 mg by mouth every morning.         . citalopram (CELEXA) 20 MG tablet   Oral   Take 20 mg by mouth daily.          . citalopram (CELEXA) 40 MG tablet   Oral   Take 40 mg by mouth daily.         . ferrous sulfate 325 (65 FE) MG tablet   Oral   Take 325 mg by mouth 2 (two) times daily.         Marland Kitchen HYDROcodone-acetaminophen (NORCO) 5-325 MG per tablet   Oral   Take 1-2 tablets by mouth every 6 (six) hours as needed for severe pain.   10 tablet   0    Triage Vitals: BP 150/86  Pulse 102  Temp(Src) 97.7 F (36.5  C) (Oral)  Resp 19  Wt 254 lb (115.214 kg)  SpO2 95%  Physical Exam  Nursing note and vitals reviewed. Constitutional: She appears well-developed and well-nourished.  HENT:  Head: Normocephalic and atraumatic.  Mouth/Throat: Oropharynx is clear and moist.  Eyes: EOM are normal. Pupils are equal, round, and reactive to light.  Neck: Normal range of motion. Neck supple.  Cardiovascular: Normal rate, regular rhythm, normal heart sounds and intact distal pulses.   Pulses:      Radial pulses are 2+ on the left side.  Pulmonary/Chest: Effort normal and breath sounds normal. She has no wheezes.  Musculoskeletal: Normal range of motion. She exhibits tenderness.  Tenderness to palpation to the left shoulder and over the left trapezius muscle. Pain with flexion and abduction of the left shoulder. Distal sensation of all fingers of the left hand is intact.   Neurological: She is alert.  Skin: Skin is warm and dry. No erythema.  No obvious erythema, edema, or warmth of the left shoulder.  Psychiatric: She has a normal mood and affect.  Her behavior is normal.    ED Course  Procedures (including critical care time)  DIAGNOSTIC STUDIES: Oxygen Saturation is 95% on room air, adequate by my interpretation.    COORDINATION OF CARE: 10:07 AM- Discussed that symptoms are likely muscular in nature. Will discharge patient with Naproxen, Ultram, and Robaxin to manage symptoms. Will discharge patient with a sling as per her request. Advised patient to alternate applying heat and ice to the area at home. Advised patient to follow up with the referred orthopedist if symptoms do not improve. Discussed treatment plan with patient at bedside and patient verbalized agreement.     Labs Review Labs Reviewed - No data to display Imaging Review No results found.  EKG Interpretation   None       MDM  No diagnosis found. Patient presenting with left shoulder pain that has been constant for the past week after bowling.  Pain worse with movement.  Pain appears to be musculoskeletal.  Patient neurovascularly intact.  Patient stable for discharge.  Return precautions given.  I personally performed the services described in this documentation, which was scribed in my presence. The recorded information has been reviewed and is accurate.     Hyman Bible, PA-C 04/16/13 1411

## 2013-04-15 NOTE — ED Notes (Signed)
Patient refused vital signs "Im hurting.  I'm ready to go".

## 2013-04-15 NOTE — ED Notes (Signed)
Pt reports pain in left shoulder, onset after bowling several days ago, worse now with movement.

## 2013-04-21 NOTE — ED Provider Notes (Signed)
Medical screening examination/treatment/procedure(s) were performed by non-physician practitioner and as supervising physician I was immediately available for consultation/collaboration.    Kathalene Frames, MD 04/21/13 [phone]

## 2013-06-29 IMAGING — CT CT ABD-PELV W/O CM
2 of 4 series · 17 of 46 positions shown, 19 images · non-contrast
Comparison: None

CLINICAL DATA: Left-sided flank pain

CT ABDOMEN AND PELVIS WITHOUT CONTRAST
TECHNIQUE: Multidetector CT imaging of the abdomen and pelvis was
performed following the standard protocol without intravenous
contrast.

[Series 2: stone study · axial · 0.61mm/px · z∈[-667,-262]mm · 14 of 89 slices shown, 16 images]
[im 4/89  soft-tissue]
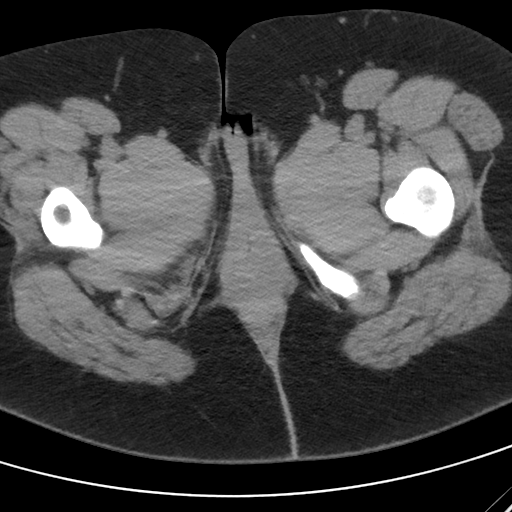
[im 4/89  bone]
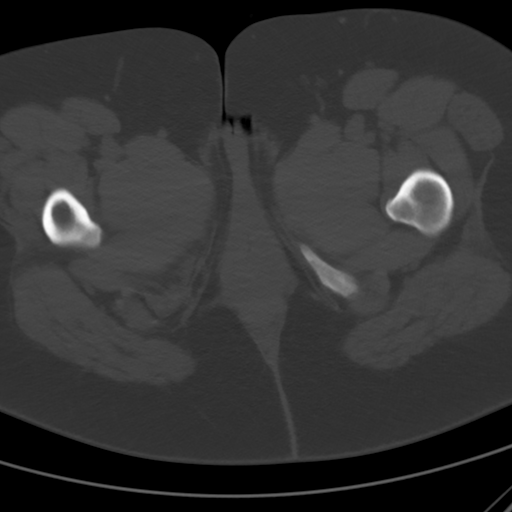
[im 12/89  soft-tissue]
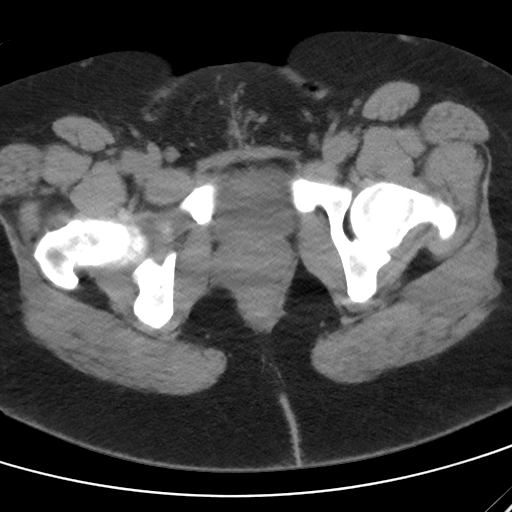
[im 16/89  soft-tissue]
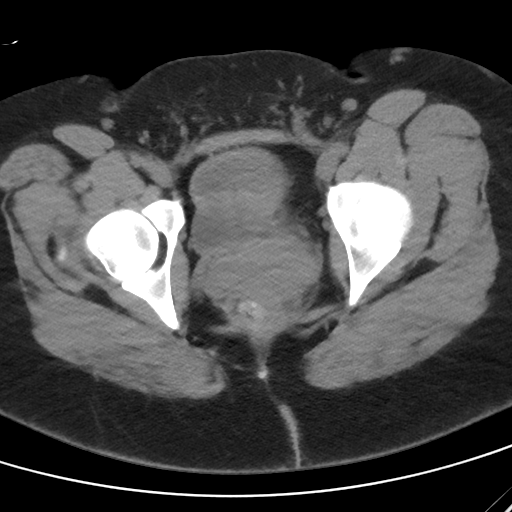
[im 23/89  soft-tissue]
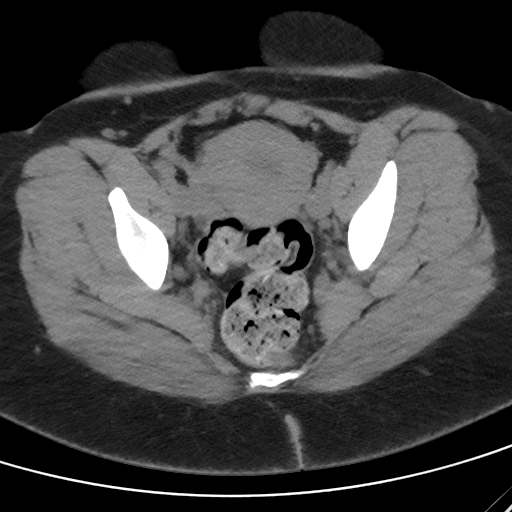
[im 31/89  soft-tissue]
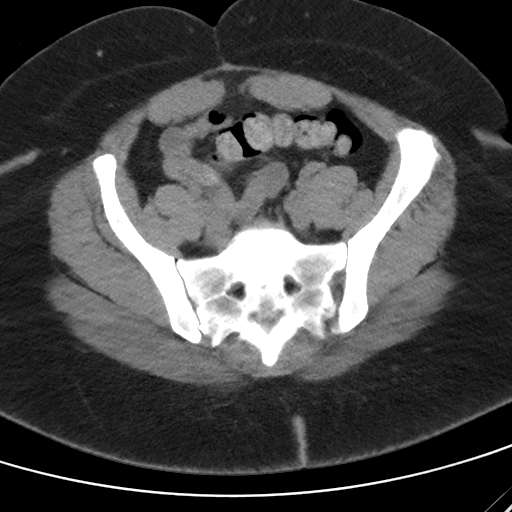
[im 35/89  soft-tissue]
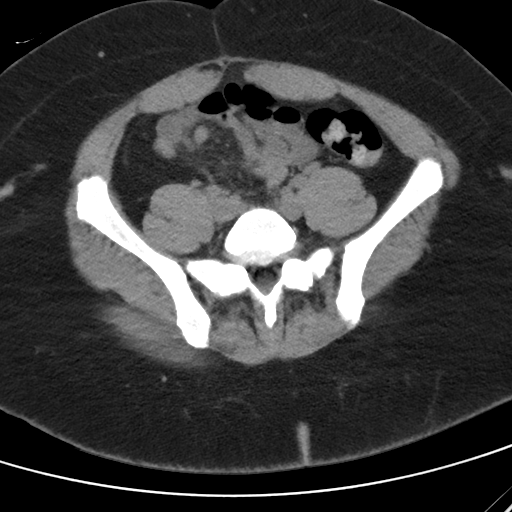
[im 43/89  soft-tissue]
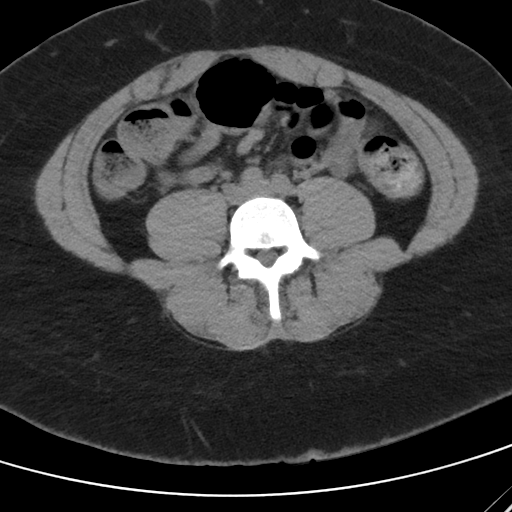
[im 46/89  soft-tissue]
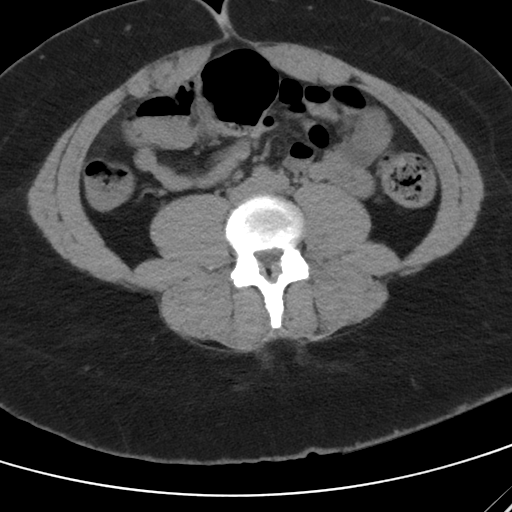
[im 54/89  soft-tissue]
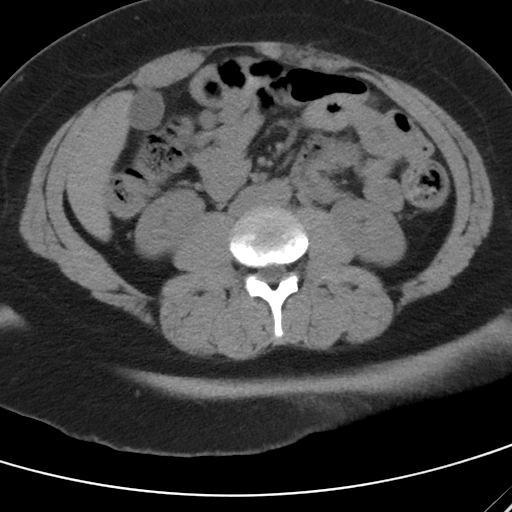
[im 54/89  bone]
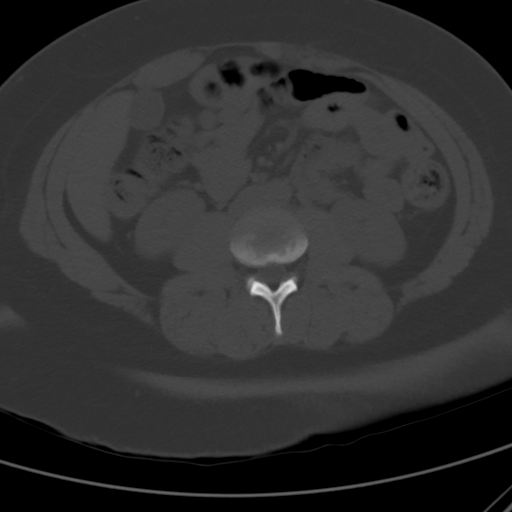
[im 58/89  soft-tissue]
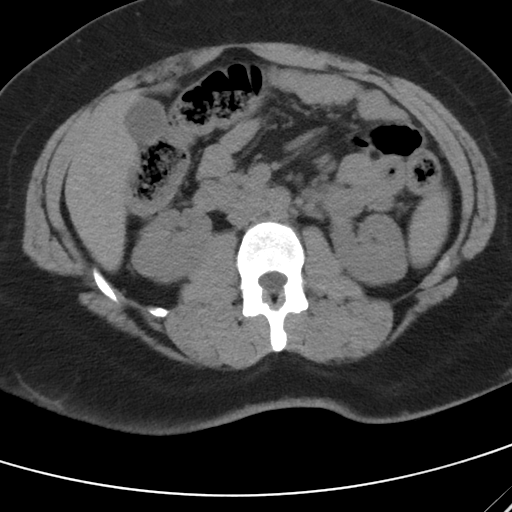
[im 66/89  soft-tissue]
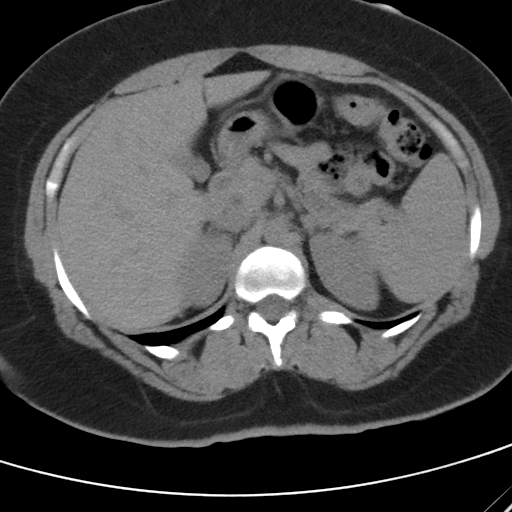
[im 73/89  soft-tissue]
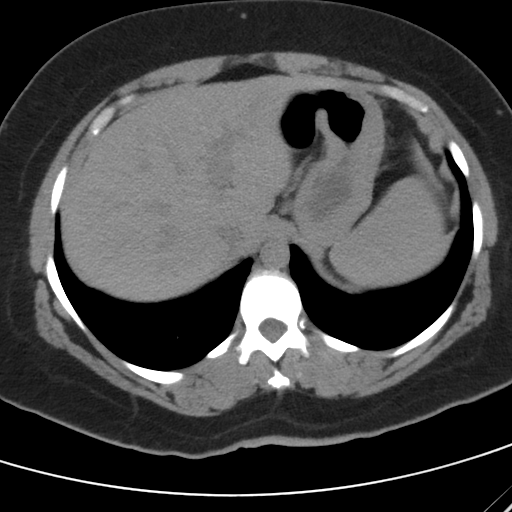
[im 77/89  soft-tissue]
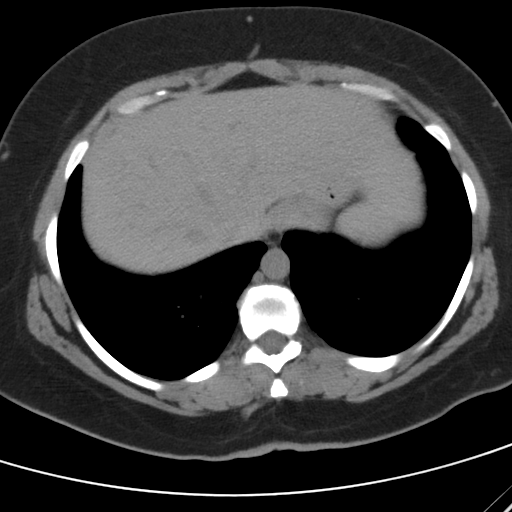
[im 85/89  soft-tissue]
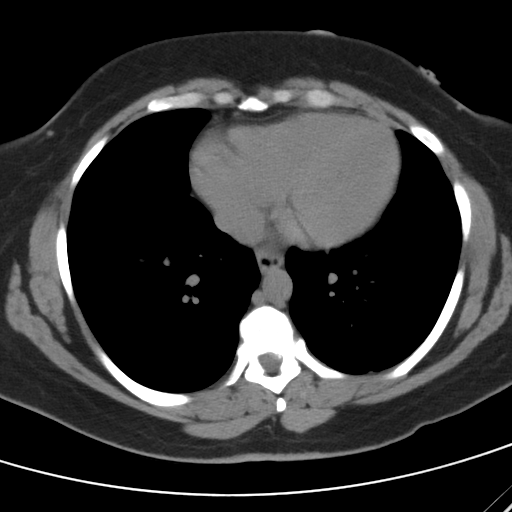

[coronals · coronal · 0.86mm/px · 3 of 85 slices shown]
[im 29/85  soft-tissue]
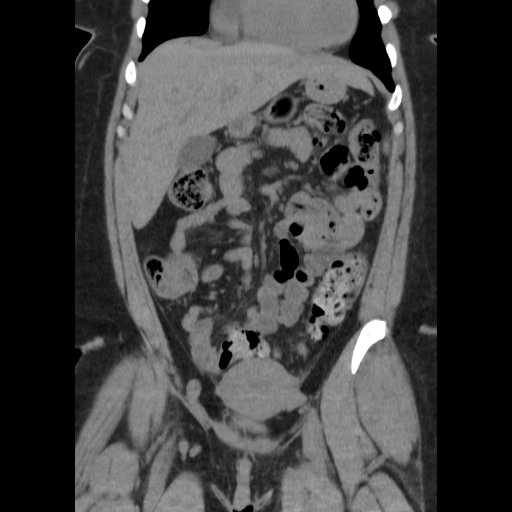
[im 38/85  soft-tissue]
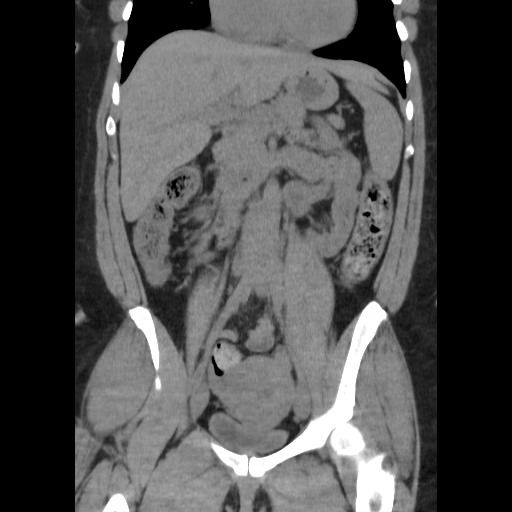
[im 47/85  soft-tissue]
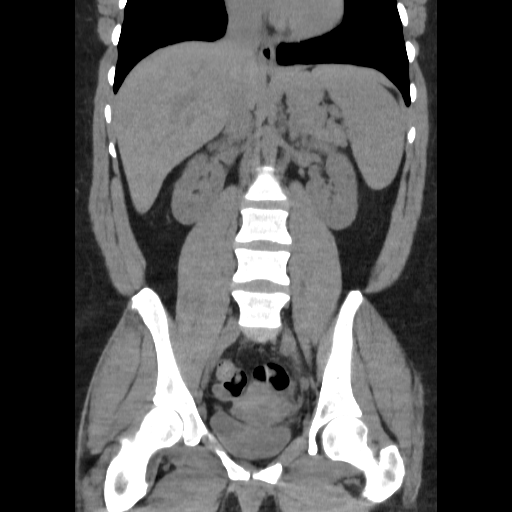

[17 of 46 positions shown; findings below may reference images not displayed]

FINDINGS: Lung bases are clear except for a small scar at the left
base.  No pleural or pericardial fluid.  The liver has a normal
appearance without contrast.  No calcified gallstones.  The spleen
is normal.  The pancreas is normal.  The adrenal glands are normal.
The kidneys are normal in size shape and position.  No evidence of
cyst, mass, stone or hydronephrosis.  No stone is seen along the
course of either ureter.  No stone in the bladder.

The aorta and IVC are normal.  The uterus appears normal.  There is
a tiny amount of free fluid in the pelvis, not likely significant.
No acute bowel pathology is evident.  Bony structures are
unremarkable.
IMPRESSION: Negative examination.  No evidence of urinary tract stone disease.
No cause of left flank pain is identified.

## 2013-07-23 ENCOUNTER — Emergency Department (HOSPITAL_COMMUNITY)
Admission: EM | Admit: 2013-07-23 | Discharge: 2013-07-23 | Disposition: A | Discharge status: Home / Self Care | Payer: Medicare Other | Attending: Emergency Medicine | Admitting: Emergency Medicine

## 2013-07-23 ENCOUNTER — Encounter (HOSPITAL_COMMUNITY): Payer: Self-pay | Admitting: Emergency Medicine

## 2013-07-23 ENCOUNTER — Emergency Department (HOSPITAL_COMMUNITY): Payer: Medicare Other

## 2013-07-23 DIAGNOSIS — Z8619 Personal history of other infectious and parasitic diseases: Secondary | ICD-10-CM | POA: Insufficient documentation

## 2013-07-23 DIAGNOSIS — F3289 Other specified depressive episodes: Secondary | ICD-10-CM | POA: Insufficient documentation

## 2013-07-23 DIAGNOSIS — Z3202 Encounter for pregnancy test, result negative: Secondary | ICD-10-CM | POA: Insufficient documentation

## 2013-07-23 DIAGNOSIS — Z79899 Other long term (current) drug therapy: Secondary | ICD-10-CM | POA: Insufficient documentation

## 2013-07-23 DIAGNOSIS — R079 Chest pain, unspecified: Secondary | ICD-10-CM | POA: Insufficient documentation

## 2013-07-23 DIAGNOSIS — Z8739 Personal history of other diseases of the musculoskeletal system and connective tissue: Secondary | ICD-10-CM | POA: Insufficient documentation

## 2013-07-23 DIAGNOSIS — N390 Urinary tract infection, site not specified: Secondary | ICD-10-CM | POA: Insufficient documentation

## 2013-07-23 DIAGNOSIS — Z8742 Personal history of other diseases of the female genital tract: Secondary | ICD-10-CM | POA: Insufficient documentation

## 2013-07-23 DIAGNOSIS — F329 Major depressive disorder, single episode, unspecified: Secondary | ICD-10-CM | POA: Insufficient documentation

## 2013-07-23 DIAGNOSIS — Z791 Long term (current) use of non-steroidal anti-inflammatories (NSAID): Secondary | ICD-10-CM | POA: Insufficient documentation

## 2013-07-23 DIAGNOSIS — J45901 Unspecified asthma with (acute) exacerbation: Secondary | ICD-10-CM | POA: Insufficient documentation

## 2013-07-23 LAB — URINALYSIS, ROUTINE W REFLEX MICROSCOPIC
BILIRUBIN URINE: NEGATIVE
Glucose, UA: NEGATIVE mg/dL
Hgb urine dipstick: NEGATIVE
KETONES UR: NEGATIVE mg/dL
Nitrite: NEGATIVE
PH: 7 (ref 5.0–8.0)
PROTEIN: NEGATIVE mg/dL
Specific Gravity, Urine: 1.022 (ref 1.005–1.030)
Urobilinogen, UA: 0.2 mg/dL (ref 0.0–1.0)

## 2013-07-23 LAB — URINE MICROSCOPIC-ADD ON

## 2013-07-23 LAB — CBC WITH DIFFERENTIAL/PLATELET
Basophils Absolute: 0 10*3/uL (ref 0.0–0.1)
Basophils Relative: 0 % (ref 0–1)
EOS ABS: 0.1 10*3/uL (ref 0.0–0.7)
Eosinophils Relative: 2 % (ref 0–5)
HCT: 36.3 % (ref 36.0–46.0)
HEMOGLOBIN: 11.8 g/dL — AB (ref 12.0–15.0)
Lymphocytes Relative: 25 % (ref 12–46)
Lymphs Abs: 1.9 10*3/uL (ref 0.7–4.0)
MCH: 28.6 pg (ref 26.0–34.0)
MCHC: 32.5 g/dL (ref 30.0–36.0)
MCV: 88.1 fL (ref 78.0–100.0)
MONOS PCT: 4 % (ref 3–12)
Monocytes Absolute: 0.3 10*3/uL (ref 0.1–1.0)
Neutro Abs: 5.4 10*3/uL (ref 1.7–7.7)
Neutrophils Relative %: 69 % (ref 43–77)
Platelets: 241 10*3/uL (ref 150–400)
RBC: 4.12 MIL/uL (ref 3.87–5.11)
RDW: 12.5 % (ref 11.5–15.5)
WBC: 7.7 10*3/uL (ref 4.0–10.5)

## 2013-07-23 LAB — TROPONIN I: Troponin I: <0.3 ng/mL (ref <0.30)

## 2013-07-23 LAB — I-STAT CHEM 8, ED
BUN: 12 mg/dL (ref 6–23)
CALCIUM ION: 1.18 mmol/L (ref 1.12–1.23)
CHLORIDE: 100 meq/L (ref 96–112)
CREATININE: 0.9 mg/dL (ref 0.50–1.10)
GLUCOSE: 99 mg/dL (ref 70–99)
HEMATOCRIT: 38 % (ref 36.0–46.0)
Hemoglobin: 12.9 g/dL (ref 12.0–15.0)
POTASSIUM: 3.9 meq/L (ref 3.7–5.3)
Sodium: 139 mEq/L (ref 137–147)
TCO2: 26 mmol/L (ref 0–100)

## 2013-07-23 LAB — POC URINE PREG, ED: PREG TEST UR: NEGATIVE

## 2013-07-23 LAB — D-DIMER, QUANTITATIVE: D-Dimer, Quant: 0.43 ug/mL-FEU (ref 0.00–0.48)

## 2013-07-23 MED ORDER — CIPROFLOXACIN HCL 500 MG PO TABS: 500.0000 mg | ORAL_TABLET | Freq: Two times a day (BID) | ORAL | Status: DC

## 2013-07-23 MED ORDER — KETOROLAC TROMETHAMINE 30 MG/ML IJ SOLN
30.0000 mg | Freq: Once | INTRAMUSCULAR | Status: AC
  Administered 2013-07-23: 30 mg via INTRAVENOUS
  Filled 2013-07-23: qty 1

## 2013-07-23 NOTE — ED Notes (Signed)
[facility]. C/o right chest wall pain which is worse when she "presses on it." Pt says pain "feels like needles." Has had cough for a while which exacerbates chest pain. Hyperventilating upon arrival. 12 Lead EKG unremarkable. No PIV inserted. No other complaints per EMS. VSS.

## 2013-07-23 NOTE — Discharge Instructions (Signed)
Chest Pain (Nonspecific) °It is often hard to give a specific diagnosis for the cause of chest pain. There is always a chance that your pain could be related to something serious, such as a heart attack or a blood clot in the lungs. You need to follow up with your caregiver for further evaluation. °CAUSES  °· Heartburn. °· Pneumonia or bronchitis. °· Anxiety or stress. °· Inflammation around your heart (pericarditis) or lung (pleuritis or pleurisy). °· A blood clot in the lung. °· A collapsed lung (pneumothorax). It can develop suddenly on its own (spontaneous pneumothorax) or from injury (trauma) to the chest. °· Shingles infection (herpes zoster virus). °The chest wall is composed of bones, muscles, and cartilage. Any of these can be the source of the pain. °· The bones can be bruised by injury. °· The muscles or cartilage can be strained by coughing or overwork. °· The cartilage can be affected by inflammation and become sore (costochondritis). °DIAGNOSIS  °Lab tests or other studies, such as X-rays, electrocardiography, stress testing, or cardiac imaging, may be needed to find the cause of your pain.  °TREATMENT  °· Treatment depends on what may be causing your chest pain. Treatment may include: °· Acid blockers for heartburn. °· Anti-inflammatory medicine. °· Pain medicine for inflammatory conditions. °· Antibiotics if an infection is present. °· You may be advised to change lifestyle habits. This includes stopping smoking and avoiding alcohol, caffeine, and chocolate. °· You may be advised to keep your head raised (elevated) when sleeping. This reduces the chance of acid going backward from your stomach into your esophagus. °· Most of the time, nonspecific chest pain will improve within 2 to 3 days with rest and mild pain medicine. °HOME CARE INSTRUCTIONS  °· If antibiotics were prescribed, take your antibiotics as directed. Finish them even if you start to feel better. °· For the next few days, avoid physical  activities that bring on chest pain. Continue physical activities as directed. °· Do not smoke. °· Avoid drinking alcohol. °· Only take over-the-counter or prescription medicine for pain, discomfort, or fever as directed by your caregiver. °· Follow your caregiver's suggestions for further testing if your chest pain does not go away. °· Keep any follow-up appointments you made. If you do not go to an appointment, you could develop lasting (chronic) problems with pain. If there is any problem keeping an appointment, you must call to reschedule. °SEEK MEDICAL CARE IF:  °· You think you are having problems from the medicine you are taking. Read your medicine instructions carefully. °· Your chest pain does not go away, even after treatment. °· You develop a rash with blisters on your chest. °SEEK IMMEDIATE MEDICAL CARE IF:  °· You have increased chest pain or pain that spreads to your arm, neck, jaw, back, or abdomen. °· You develop shortness of breath, an increasing cough, or you are coughing up blood. °· You have severe back or abdominal pain, feel nauseous, or vomit. °· You develop severe weakness, fainting, or chills. °· You have a fever. °THIS IS AN EMERGENCY. Do not wait to see if the pain will go away. Get medical help at once. Call your local emergency services (911 in [place] Do not drive yourself to the hospital. °MAKE SURE YOU:  °· Understand these instructions. °· Will watch your condition. °· Will get help right away if you are not doing well or get worse. °Document Released: 12/20/2004 Document Revised: 06/04/2011 Document Reviewed: 10/16/2007 °[employer]® Patient Information ©2014 [employer]   [employer].  Urinary Tract Infection Urinary tract infections (UTIs) can develop anywhere along your urinary tract. Your urinary tract is your body's drainage system for removing wastes and extra water. Your urinary tract includes two kidneys, two ureters, a bladder, and a urethra. Your kidneys are a pair of bean-shaped  organs. Each kidney is about the size of your fist. They are located below your ribs, one on each side of your spine. CAUSES Infections are caused by microbes, which are microscopic organisms, including fungi, viruses, and bacteria. These organisms are so small that they can only be seen through a microscope. Bacteria are the microbes that most commonly cause UTIs. SYMPTOMS  Symptoms of UTIs may vary by age and gender of the patient and by the location of the infection. Symptoms in young women typically include a frequent and intense urge to urinate and a painful, burning feeling in the bladder or urethra during urination. Older women and men are more likely to be tired, shaky, and weak and have muscle aches and abdominal pain. A fever may mean the infection is in your kidneys. Other symptoms of a kidney infection include pain in your back or sides below the ribs, nausea, and vomiting. DIAGNOSIS To diagnose a UTI, your caregiver will ask you about your symptoms. Your caregiver also will ask to provide a urine sample. The urine sample will be tested for bacteria and white blood cells. White blood cells are made by your body to help fight infection. TREATMENT  Typically, UTIs can be treated with medication. Because most UTIs are caused by a bacterial infection, they usually can be treated with the use of antibiotics. The choice of antibiotic and length of treatment depend on your symptoms and the type of bacteria causing your infection. HOME CARE INSTRUCTIONS  If you were prescribed antibiotics, take them exactly as your caregiver instructs you. Finish the medication even if you feel better after you have only taken some of the medication.  Drink enough water and fluids to keep your urine clear or pale yellow.  Avoid caffeine, tea, and carbonated beverages. They tend to irritate your bladder.  Empty your bladder often. Avoid holding urine for long periods of time.  Empty your bladder before and  after sexual intercourse.  After a bowel movement, women should cleanse from front to back. Use each tissue only once. SEEK MEDICAL CARE IF:   You have back pain.  You develop a fever.  Your symptoms do not begin to resolve within 3 days. SEEK IMMEDIATE MEDICAL CARE IF:   You have severe back pain or lower abdominal pain.  You develop chills.  You have nausea or vomiting.  You have continued burning or discomfort with urination. MAKE SURE YOU:   Understand these instructions.  Will watch your condition.  Will get help right away if you are not doing well or get worse. Document Released: 12/20/2004 Document Revised: 09/11/2011 Document Reviewed: 04/20/2011 [facility] Patient Information 2014 [place].

## 2013-07-23 NOTE — ED Notes (Signed)
Patient transported to Radiology 

## 2013-07-23 NOTE — ED Provider Notes (Signed)
CSN: [id number]     Arrival date & time 07/23/13  1254 History   First MD Initiated Contact with Patient 07/23/13 1309     Chief Complaint  Patient presents with  . Chest Pain     (Consider location/radiation/quality/duration/timing/severity/associated sxs/prior Treatment) HPI Comments: Patient presents with chest pain. She states it started this afternoon with initially pain on the left side. She states that pain went away and now she has some pain in the right side. She describes a sharp needlelike. She at times has had shortness of breath but denies any current shortness of breath. She denies any nausea or vomiting. She denies any definite diaphoresis to me that is documented in nursing notes that she reported diaphoresis. She denies feeling dizzy or lightheaded. She denies any leg pain or swelling. She denies a known history of heart problems in the past. She states the pain is worse with movement. The pain is nonpleuritic and nonexertional. She's had a little bit of cough over last couple days but denies any fevers. There is no history of drug abuse in the medical history however patient denies any drug abuse. She does say that she drinks alcohol but the last time was about 2 weeks ago. She denies history of diabetes or hyperlipidemia. She does take medicine for her cholesterol. She denies any tobacco use.  Patient is a 48 y.o. female presenting with chest pain.  Chest Pain Associated symptoms: cough and shortness of breath   Associated symptoms: no abdominal pain, no back pain, no diaphoresis, no dizziness, no fatigue, no fever, no headache, no nausea, no numbness, not vomiting and no weakness     Past Medical History  Diagnosis Date  . Asthma   . Sleep apnea   . Headache(784.0)   . Depression     on meds  . Drug abuse 2002  . HSV (herpes simplex virus) infection   . Ovarian cyst   . Muscle spasms of head or neck    Past Surgical History  Procedure Laterality Date  . Cesarean  section    . Tubal ligation     Family History  Problem Relation Age of Onset  . Anesthesia problems Neg Hx   . Hypotension Neg Hx   . Malignant hyperthermia Neg Hx   . Pseudochol deficiency Neg Hx   . Diabetes Mother   . Alcohol abuse Father   . Liver disease Father    History  Substance Use Topics  . Smoking status: Never Smoker   . Smokeless tobacco: Never Used  . Alcohol Use: No   OB History   Grav Para Term Preterm Abortions TAB SAB Ect Mult Living   5 5 5  0 0 0 0 0 0 5     Review of Systems  Constitutional: Negative for fever, chills, diaphoresis and fatigue.  HENT: Negative for congestion, rhinorrhea and sneezing.   Eyes: Negative.   Respiratory: Positive for cough and shortness of breath. Negative for chest tightness.   Cardiovascular: Positive for chest pain. Negative for leg swelling.  Gastrointestinal: Negative for nausea, vomiting, abdominal pain, diarrhea and blood in stool.  Genitourinary: Positive for dysuria. Negative for frequency, hematuria, flank pain and difficulty urinating.  Musculoskeletal: Negative for arthralgias and back pain.  Skin: Negative for rash.  Neurological: Negative for dizziness, speech difficulty, weakness, numbness and headaches.      Allergies  Review of patient's allergies indicates no known allergies.  Home Medications   Prior to Admission medications   Medication Sig  Start Date End Date Taking? Authorizing Provider  cetirizine (ZYRTEC) 10 MG tablet Take 10 mg by mouth every morning.   Yes Historical Provider, MD  traZODone (DESYREL) 100 MG tablet Take 100 mg by mouth at bedtime.  07/01/13  Yes Historical Provider, MD  citalopram (CELEXA) 20 MG tablet Take 20 mg by mouth daily.     Historical Provider, MD  citalopram (CELEXA) 40 MG tablet Take 40 mg by mouth daily.    Historical Provider, MD  ferrous sulfate 325 (65 FE) MG tablet Take 325 mg by mouth 2 (two) times daily.    Historical Provider, MD  gemfibrozil (LOPID) 600 MG  tablet  05/06/13   Historical Provider, MD  HYDROcodone-acetaminophen (NORCO/VICODIN) 5-325 MG per tablet  05/05/13   Historical Provider, MD  methocarbamol (ROBAXIN) 500 MG tablet Take 1 tablet (500 mg total) by mouth 2 (two) times daily. 04/15/13   Heather Laisure, PA-C  naproxen (NAPROSYN) 500 MG tablet Take 1 tablet (500 mg total) by mouth 2 (two) times daily. 04/15/13   Hyman Bible, PA-C  PROAIR HFA 108 (90 BASE) MCG/ACT inhaler  05/04/13   Historical Provider, MD  traMADol (ULTRAM) 50 MG tablet Take 1 tablet (50 mg total) by mouth every 6 (six) hours as needed. 04/15/13   Heather Laisure, PA-C   BP 143/101  Pulse 83  Temp(Src) 98.3 F (36.8 C) (Oral)  Resp 18  SpO2 100% Physical Exam  Constitutional: She is oriented to person, place, and time. She appears well-developed and well-nourished.  HENT:  Head: Normocephalic and atraumatic.  Eyes: Pupils are equal, round, and reactive to light.  Neck: Normal range of motion. Neck supple.  Cardiovascular: Normal rate, regular rhythm and normal heart sounds.   Pulmonary/Chest: Effort normal and breath sounds normal. No respiratory distress. She has no wheezes. She has no rales. She exhibits tenderness (Positive reproducible tenderness to the right chest wall).  Abdominal: Soft. Bowel sounds are normal. There is no tenderness. There is no rebound and no guarding.  Musculoskeletal: Normal range of motion. She exhibits no edema and no tenderness.  No calf tenderness  Lymphadenopathy:    She has no cervical adenopathy.  Neurological: She is alert and oriented to person, place, and time.  Skin: Skin is warm and dry. No rash noted.  Psychiatric: She has a normal mood and affect.    ED Course  Procedures (including critical care time) Labs Review Results for orders placed during the hospital encounter of 07/23/13  TROPONIN I      Result Value Ref Range   Troponin I <0.30  <0.30 ng/mL  CBC WITH DIFFERENTIAL      Result Value Ref Range   WBC  7.7  4.0 - 10.5 K/uL   RBC 4.12  3.87 - 5.11 MIL/uL   Hemoglobin 11.8 (*) 12.0 - 15.0 g/dL   HCT 36.3  36.0 - 46.0 %   MCV 88.1  78.0 - 100.0 fL   MCH 28.6  26.0 - 34.0 pg   MCHC 32.5  30.0 - 36.0 g/dL   RDW 12.5  11.5 - 15.5 %   Platelets 241  150 - 400 K/uL   Neutrophils Relative % 69  43 - 77 %   Neutro Abs 5.4  1.7 - 7.7 K/uL   Lymphocytes Relative 25  12 - 46 %   Lymphs Abs 1.9  0.7 - 4.0 K/uL   Monocytes Relative 4  3 - 12 %   Monocytes Absolute 0.3  0.1 - 1.0  K/uL   Eosinophils Relative 2  0 - 5 %   Eosinophils Absolute 0.1  0.0 - 0.7 K/uL   Basophils Relative 0  0 - 1 %   Basophils Absolute 0.0  0.0 - 0.1 K/uL  D-DIMER, QUANTITATIVE      Result Value Ref Range   D-Dimer, Quant 0.43  0.00 - 0.48 ug/mL-FEU  URINALYSIS, ROUTINE W REFLEX MICROSCOPIC      Result Value Ref Range   Color, Urine YELLOW  YELLOW   APPearance CLOUDY (*) CLEAR   Specific Gravity, Urine 1.022  1.005 - 1.030   pH 7.0  5.0 - 8.0   Glucose, UA NEGATIVE  NEGATIVE mg/dL   Hgb urine dipstick NEGATIVE  NEGATIVE   Bilirubin Urine NEGATIVE  NEGATIVE   Ketones, ur NEGATIVE  NEGATIVE mg/dL   Protein, ur NEGATIVE  NEGATIVE mg/dL   Urobilinogen, UA 0.2  0.0 - 1.0 mg/dL   Nitrite NEGATIVE  NEGATIVE   Leukocytes, UA SMALL (*) NEGATIVE  URINE MICROSCOPIC-ADD ON      Result Value Ref Range   Squamous Epithelial / LPF MANY (*) RARE   WBC, UA 7-10  <3 WBC/hpf   RBC / HPF 0-2  <3 RBC/hpf   Bacteria, UA MANY (*) RARE   Urine-Other MUCOUS PRESENT    I-STAT CHEM 8, ED      Result Value Ref Range   Sodium 139  137 - 147 mEq/L   Potassium 3.9  3.7 - 5.3 mEq/L   Chloride 100  96 - 112 mEq/L   BUN 12  6 - 23 mg/dL   Creatinine, Ser 3.33  0.50 - 1.10 mg/dL   Glucose, Bld 99  70 - 99 mg/dL   Calcium, Ion 5.45  6.25 - 1.23 mmol/L   TCO2 26  0 - 100 mmol/L   Hemoglobin 12.9  12.0 - 15.0 g/dL   HCT 63.8  93.7 - 34.2 %  POC URINE PREG, ED      Result Value Ref Range   Preg Test, Ur NEGATIVE  NEGATIVE   Dg Chest  2 View  07/23/2013   CLINICAL DATA:  Chest pain, shortness breath  EXAM: CHEST  2 VIEW  COMPARISON:  Radiographs 07/23/2013  FINDINGS: Normal mediastinum and cardiac silhouette. Normal pulmonary vasculature. No evidence of effusion, infiltrate, or pneumothorax. No acute bony abnormality.  IMPRESSION: No acute cardiopulmonary process.   Electronically Signed   By: Genevive Bi M.D.   On: 07/23/2013 13:57      Imaging Review Dg Chest 2 View  07/23/2013   CLINICAL DATA:  Chest pain, shortness breath  EXAM: CHEST  2 VIEW  COMPARISON:  Radiographs 07/23/2013  FINDINGS: Normal mediastinum and cardiac silhouette. Normal pulmonary vasculature. No evidence of effusion, infiltrate, or pneumothorax. No acute bony abnormality.  IMPRESSION: No acute cardiopulmonary process.   Electronically Signed   By: Genevive Bi M.D.   On: 07/23/2013 13:57     EKG Interpretation   Date/Time:  Thursday July 23 2013 13:02:47 EDT Ventricular Rate:  82 PR Interval:  142 QRS Duration: 83 QT Interval:  376 QTC Calculation: 439 R Axis:   87 Text Interpretation:  Sinus rhythm since last tracing no significant  change Confirmed by Tishana Clinkenbeard  MD, Doran Nestle ([id number]) on 07/23/2013 1:30:18 PM      MDM   Final diagnoses:  Chest pain  UTI (lower urinary tract infection)    Patient presents with a total type chest pain. Is reproducible on palpation. She has no  hypoxia or persistent tachycardia. She has no ischemic changes on EKG. Her troponin is negative. Her story does not sound consistent with acute coronary syndrome. Her d-dimer is negative and there is no other suggestions of pulmonary embolus. She was discharged in good condition with symptomatic care. I did encourage her to have close followup with her primary care physician. She does have some burning on urination and has evidence of UTI on her urinalysis. Her urine was sent for culture and prescribe her Cipro for that. Also encouraged her to have her blood pressure  followed by her primary care physician.    Malvin Johns, MD 07/23/13 [phone]

## 2013-07-23 NOTE — ED Notes (Signed)
Bed: WA02 Expected date:  Expected time:  Means of arrival:  Comments: EMS- anxiety, chest pain

## 2013-07-23 NOTE — ED Notes (Signed)
Per patient- right chest pain started today feeling "sharp" and felt "pressure." No radiation. Did report diaphoresis with chest pain but denies any other symptoms. Denies anything making pain better or worse. No hx of heart problems. Pt thinks her mom may have had heart issues. EKG completed upon arrival. Pt does have strong, dry cough and has has had this for "a couple of days." Denies fevers but does have chills at times. No other complaints at this time.

## 2013-07-24 LAB — URINE CULTURE
COLONY COUNT: NO GROWTH
CULTURE: NO GROWTH

## 2013-11-07 IMAGING — US US TRANSVAGINAL NON-OB
1 series · 13 of 25 positions shown · non-contrast
Comparison: None.

CLINICAL DATA: Menorrhagia

TRANSABDOMINAL AND TRANSVAGINAL ULTRASOUND OF PELVIS
TECHNIQUE: Both transabdominal and transvaginal ultrasound
examinations of the pelvis were performed. Transabdominal technique
was performed for global imaging of the pelvis including uterus,
ovaries, adnexal regions, and pelvic cul-de-sac.

[Series 1: us transvaginal non-ob · 13 of 50 slices shown]
[im 1/50]
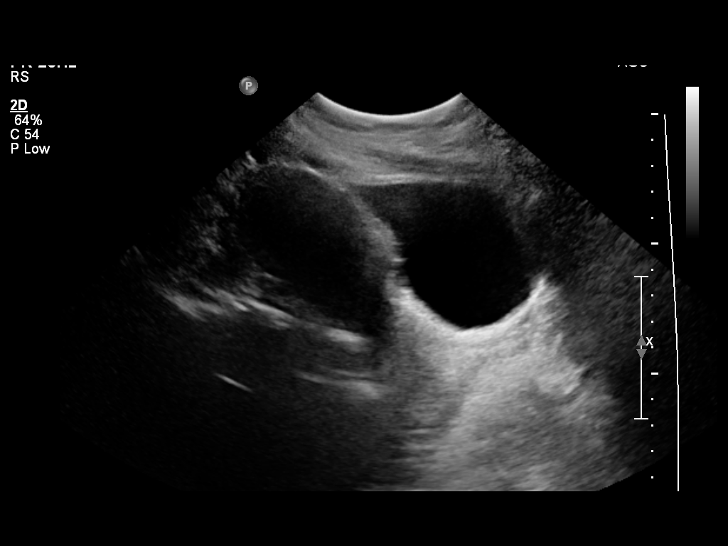
[im 5/50]
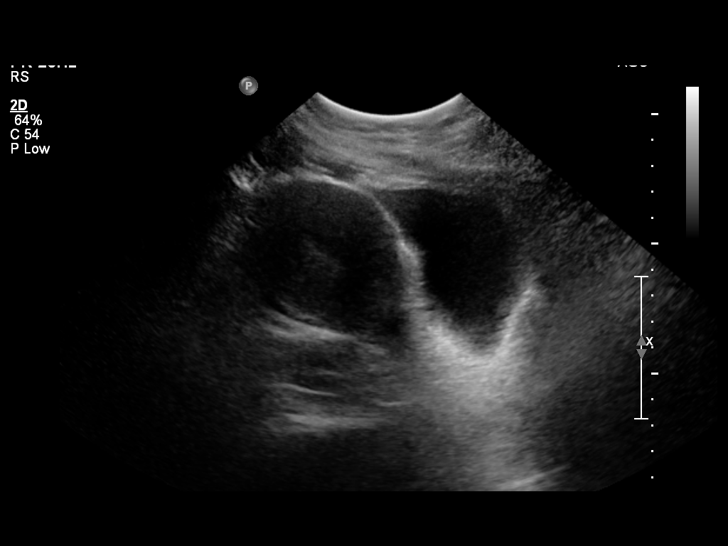
[im 9/50]
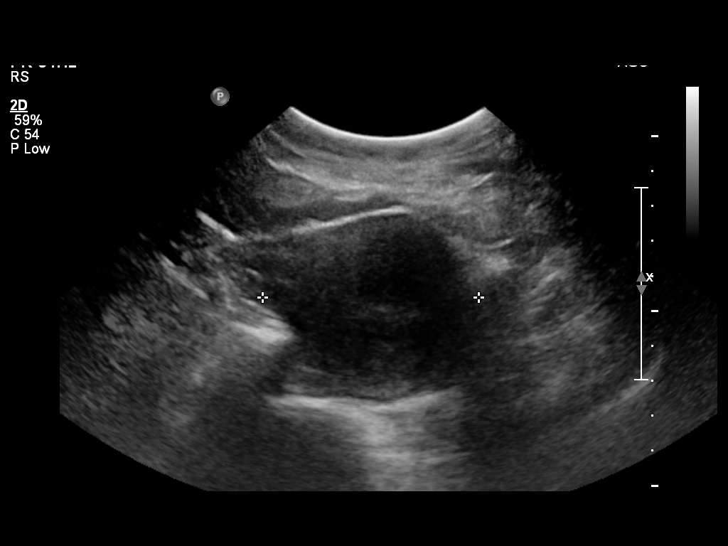
[im 13/50]
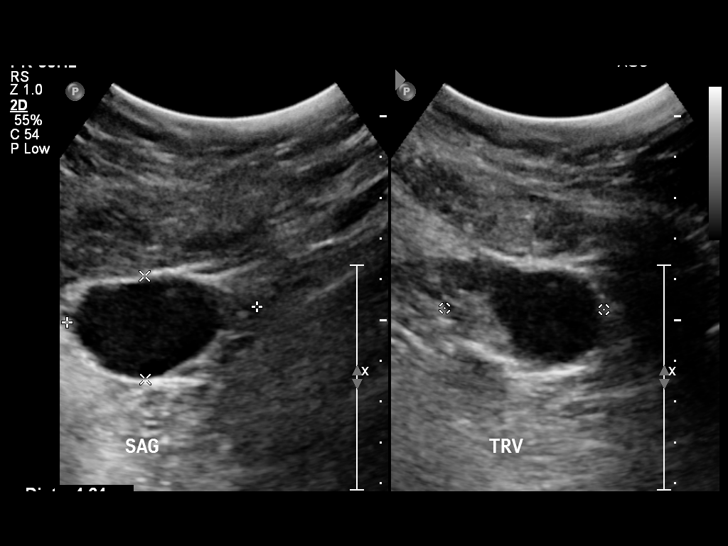
[im 17/50]
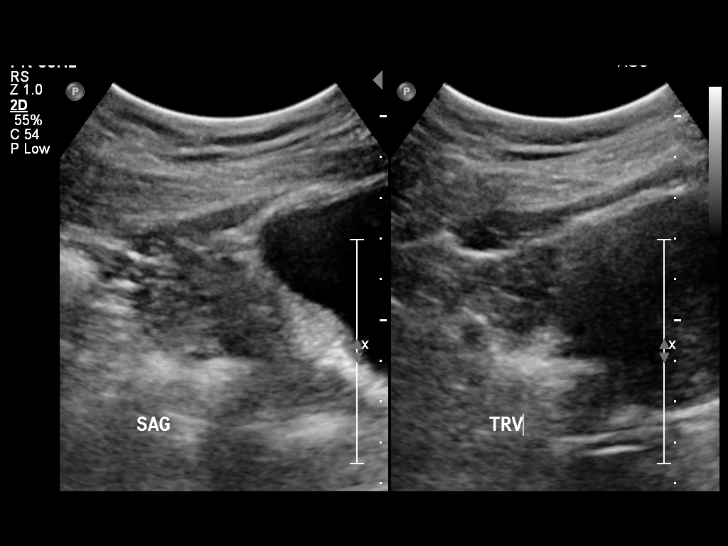
[im 21/50]
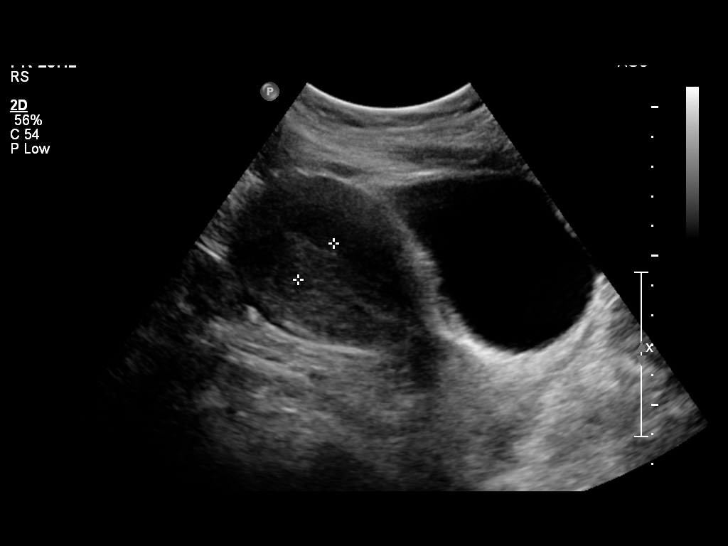
[im 25/50]
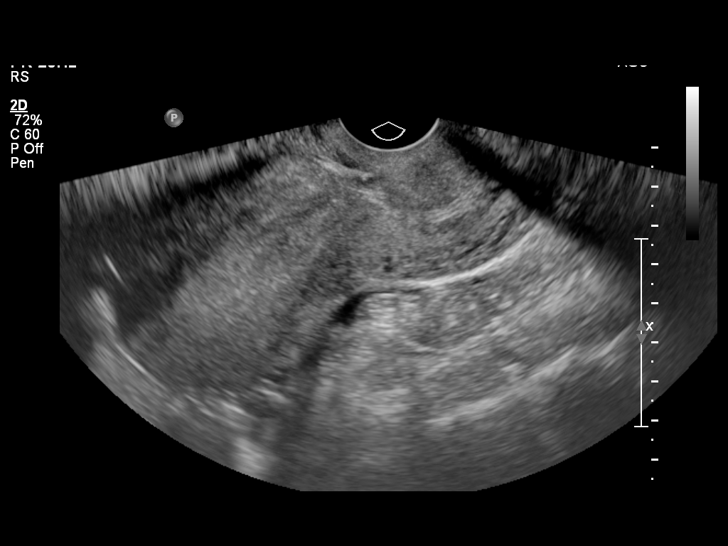
[im 29/50]
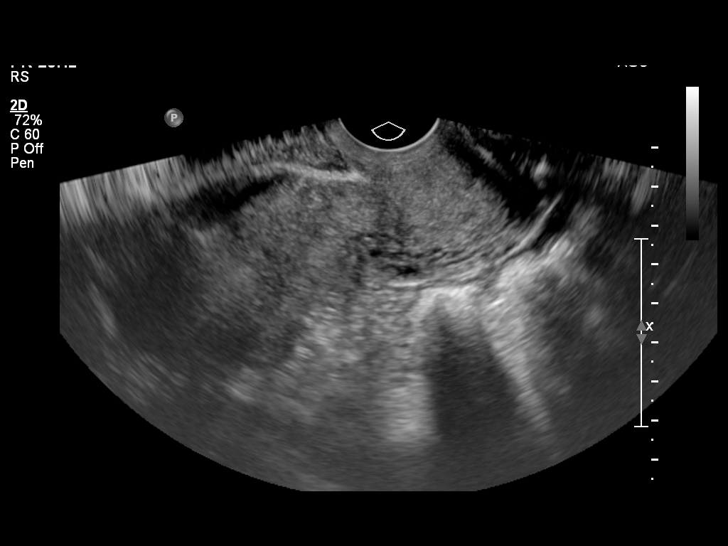
[im 33/50]
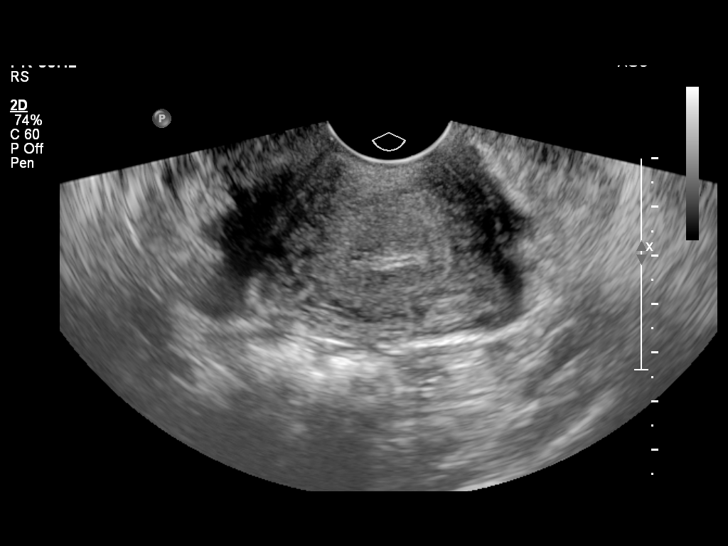
[im 37/50]
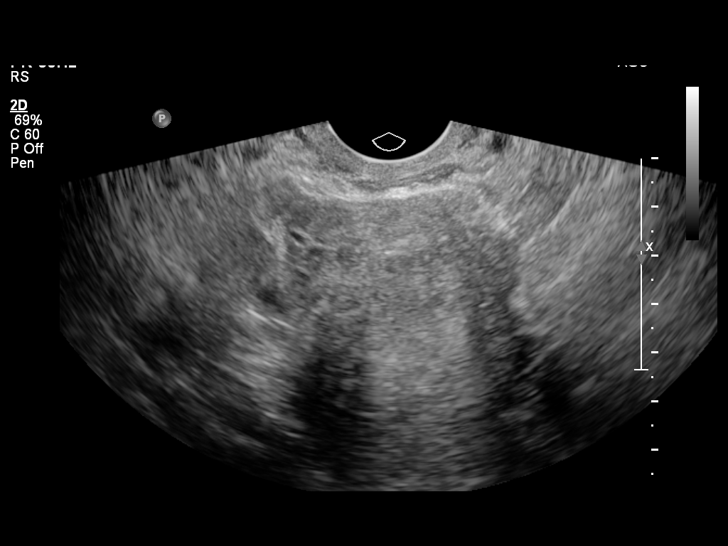
[im 41/50]
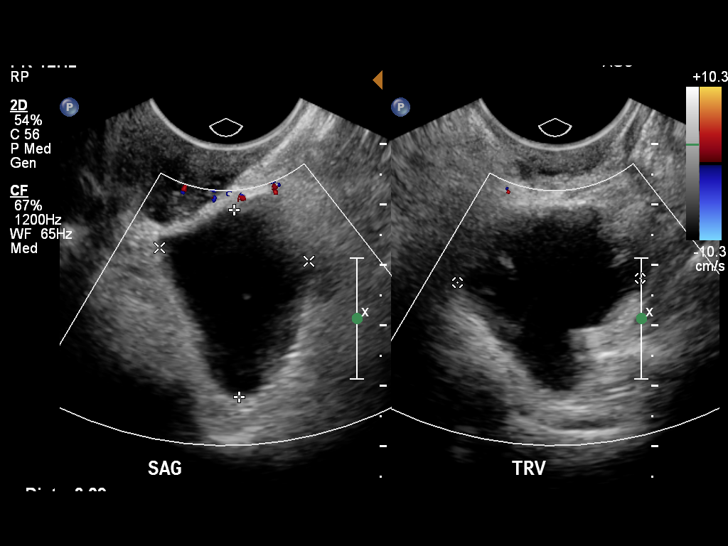
[im 45/50]
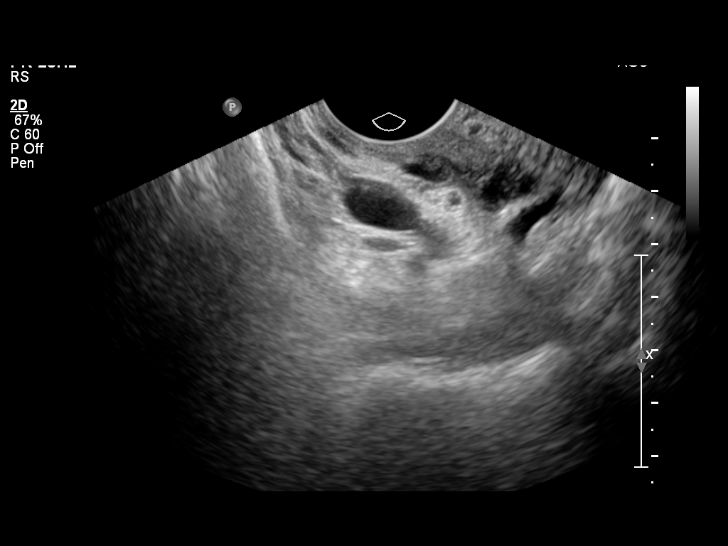
[im 50/50]
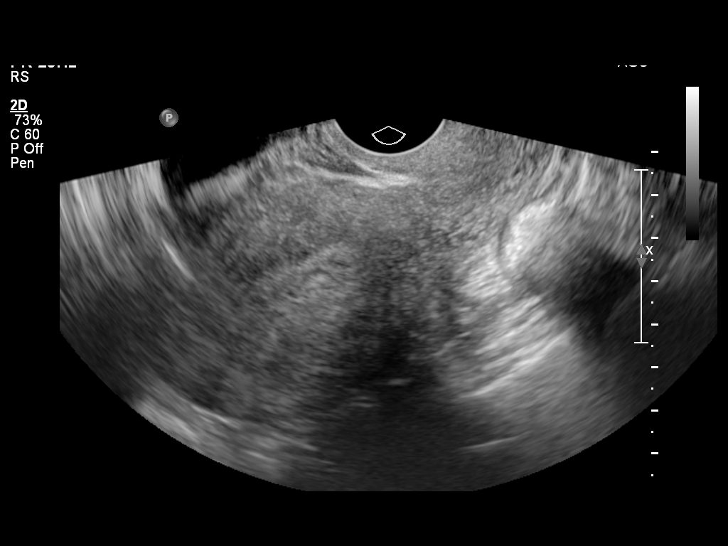

[13 of 25 positions shown; findings below may reference images not displayed]

It was necessary to proceed with endovaginal exam following the
transabdominal exam to visualize the endometrium and left ovary.
FINDINGS: Uterus: Anteverted, mildly retroflexed, which renders visualization
of the fundus somewhat suboptimal.  Overall measurements 11.4 x
x 5.1 cm.  The fundal intramural / subserosal fibroid versus focal
adenomyosis measures 4.5 x 5.4 x 2.7 cm.  No appreciable mass
effect upon the endometrium is identified.

Endometrium: 1.7 cm.  Uniformly echogenic.

Right ovary:  3.3 x 2.2 x 1.8 cm.  Normal.  Seen transabdominally
only.

Left ovary: 4.6 x 3.9 x 2.5 cm.  In the left adnexa, there is a
x 3.3 x 2.6 cm cystic structure with suggestion of either a single
thin internal septation or possibly partial mural infolding.  This
could represent an ovarian cyst, less likely hydrosalpinx.

Other findings: Small free fluid noted in the cul-de-sac.
IMPRESSION: Left ovarian cyst or less likely a small hydrosalpinx in the left
adnexa.  Follow-up pelvic ultrasound is recommended in 6-8 weeks.

Mild prominence of the endometrium without focal thickening.
Consider re-imaging during the week following the patient's menses
for improved visualization of the endometrium and better evaluation
of possible underlying lesion which may be obscured today..

## 2014-01-25 ENCOUNTER — Encounter (HOSPITAL_COMMUNITY): Payer: Self-pay | Admitting: Emergency Medicine

## 2014-02-05 ENCOUNTER — Emergency Department (HOSPITAL_COMMUNITY): Payer: Medicare Other

## 2014-02-05 ENCOUNTER — Emergency Department (HOSPITAL_COMMUNITY)
Admission: EM | Admit: 2014-02-05 | Discharge: 2014-02-05 | Disposition: A | Discharge status: Home / Self Care | Payer: Medicare Other | Attending: Emergency Medicine | Admitting: Emergency Medicine

## 2014-02-05 ENCOUNTER — Encounter (HOSPITAL_COMMUNITY): Payer: Self-pay | Admitting: Emergency Medicine

## 2014-02-05 DIAGNOSIS — Z8669 Personal history of other diseases of the nervous system and sense organs: Secondary | ICD-10-CM | POA: Insufficient documentation

## 2014-02-05 DIAGNOSIS — Z79899 Other long term (current) drug therapy: Secondary | ICD-10-CM | POA: Insufficient documentation

## 2014-02-05 DIAGNOSIS — Z8739 Personal history of other diseases of the musculoskeletal system and connective tissue: Secondary | ICD-10-CM | POA: Insufficient documentation

## 2014-02-05 DIAGNOSIS — F329 Major depressive disorder, single episode, unspecified: Secondary | ICD-10-CM | POA: Diagnosis not present

## 2014-02-05 DIAGNOSIS — J069 Acute upper respiratory infection, unspecified: Secondary | ICD-10-CM

## 2014-02-05 DIAGNOSIS — R112 Nausea with vomiting, unspecified: Secondary | ICD-10-CM

## 2014-02-05 DIAGNOSIS — R05 Cough: Secondary | ICD-10-CM

## 2014-02-05 DIAGNOSIS — Z8619 Personal history of other infectious and parasitic diseases: Secondary | ICD-10-CM | POA: Insufficient documentation

## 2014-02-05 DIAGNOSIS — R51 Headache: Secondary | ICD-10-CM | POA: Insufficient documentation

## 2014-02-05 DIAGNOSIS — Z8742 Personal history of other diseases of the female genital tract: Secondary | ICD-10-CM | POA: Insufficient documentation

## 2014-02-05 DIAGNOSIS — R059 Cough, unspecified: Secondary | ICD-10-CM

## 2014-02-05 DIAGNOSIS — R0789 Other chest pain: Secondary | ICD-10-CM | POA: Diagnosis not present

## 2014-02-05 LAB — BASIC METABOLIC PANEL
ANION GAP: 12 (ref 5–15)
BUN: 13 mg/dL (ref 6–23)
CHLORIDE: 100 meq/L (ref 96–112)
CO2: 23 mEq/L (ref 19–32)
Calcium: 8.8 mg/dL (ref 8.4–10.5)
Creatinine, Ser: 0.96 mg/dL (ref 0.50–1.10)
GFR calc Af Amer: 81 mL/min — ABNORMAL LOW (ref >90)
GFR calc non Af Amer: 70 mL/min — ABNORMAL LOW (ref >90)
Glucose, Bld: 134 mg/dL — ABNORMAL HIGH (ref 70–99)
Potassium: 3.6 mEq/L — ABNORMAL LOW (ref 3.7–5.3)
SODIUM: 135 meq/L — AB (ref 137–147)

## 2014-02-05 LAB — CBC WITH DIFFERENTIAL/PLATELET
BASOS ABS: 0 10*3/uL (ref 0.0–0.1)
Basophils Relative: 1 % (ref 0–1)
Eosinophils Absolute: 0.2 10*3/uL (ref 0.0–0.7)
Eosinophils Relative: 5 % (ref 0–5)
HCT: 37.1 % (ref 36.0–46.0)
Hemoglobin: 11.9 g/dL — ABNORMAL LOW (ref 12.0–15.0)
LYMPHS PCT: 29 % (ref 12–46)
Lymphs Abs: 1.2 10*3/uL (ref 0.7–4.0)
MCH: 28.3 pg (ref 26.0–34.0)
MCHC: 32.1 g/dL (ref 30.0–36.0)
MCV: 88.3 fL (ref 78.0–100.0)
Monocytes Absolute: 0.4 10*3/uL (ref 0.1–1.0)
Monocytes Relative: 8 % (ref 3–12)
NEUTROS ABS: 2.4 10*3/uL (ref 1.7–7.7)
NEUTROS PCT: 57 % (ref 43–77)
PLATELETS: 171 10*3/uL (ref 150–400)
RBC: 4.2 MIL/uL (ref 3.87–5.11)
RDW: 13 % (ref 11.5–15.5)
WBC: 4.2 10*3/uL (ref 4.0–10.5)

## 2014-02-05 MED ORDER — LORAZEPAM 2 MG/ML IJ SOLN: 2.0000 mg | Freq: Once | INTRAMUSCULAR | Status: DC

## 2014-02-05 MED ORDER — ONDANSETRON 4 MG PO TBDP
4.0000 mg | ORAL_TABLET | Freq: Once | ORAL | Status: AC
  Administered 2014-02-05: 4 mg via ORAL
  Filled 2014-02-05: qty 1

## 2014-02-05 MED ORDER — PROMETHAZINE HCL 25 MG PO TABS: 25.0000 mg | ORAL_TABLET | Freq: Four times a day (QID) | ORAL | Status: DC | PRN

## 2014-02-05 MED ORDER — SODIUM CHLORIDE 0.9 % IV BOLUS (SEPSIS)
1000.0000 mL | Freq: Once | INTRAVENOUS | Status: AC
  Administered 2014-02-05: 1000 mL via INTRAVENOUS

## 2014-02-05 MED ORDER — LORAZEPAM 2 MG/ML IJ SOLN
1.0000 mg | Freq: Once | INTRAMUSCULAR | Status: AC
  Administered 2014-02-05: 1 mg via INTRAVENOUS
  Filled 2014-02-05: qty 1

## 2014-02-05 MED ORDER — HYDROCODONE-ACETAMINOPHEN 5-325 MG PO TABS: ORAL_TABLET | ORAL | Status: DC

## 2014-02-05 MED ORDER — HYDROCODONE-ACETAMINOPHEN 7.5-325 MG/15ML PO SOLN
10.0000 mL | Freq: Once | ORAL | Status: AC
  Administered 2014-02-05: 10 mL via ORAL
  Filled 2014-02-05: qty 15

## 2014-02-05 NOTE — ED Notes (Signed)
Pt vomited after consuming half her ginger ale. Cleaned pt bedding and changed her gown. Emesis volume was roughly 25 ml.

## 2014-02-05 NOTE — Discharge Instructions (Signed)
Return to the emergency room for any worsening or concerning symptoms including fast breathing, heart racing, confusion, vomiting.  Rest, cover your mouth when you cough and wash your hands frequently.   Push fluids: water or Gatorade, do not drink any soda, juice or caffeinated beverages.  For fever and pain control you can take Motrin (ibuprofen) as follows: 400 mg (this is normally 2 over the counter pills) every 4 hours with food.  Do not return to work until a day after your fever breaks.   Take vicodin  for cough and pain control, do not drink alcohol, drive, care for children or do other critical tasks while taking vicodin

## 2014-02-05 NOTE — ED Notes (Signed)
Gave pt a gingerale  

## 2014-02-05 NOTE — ED Provider Notes (Signed)
CSN: [id number]     Arrival date & time 02/05/14  1500 History   First MD Initiated Contact with Patient 02/05/14 1739     Chief Complaint  Patient presents with  . Cough  . Emesis     (Consider location/radiation/quality/duration/timing/severity/associated sxs/prior Treatment) HPI   Tiffany Blake is a 48 y.o. female with past medical history significant for asthma complaining of dry cough onset this morning associated rhinorrhea, myalgia, headache, pleurtic chest pain, single episode of nonbloody, nonbilious, coffee-ground emesis this morning, reduced by mouth intake because she states that nothing tastes good. Patient denies fever, chills, shortness of breath, abdominal pain. Patient has had her flu shot this year, has positive sick contacts in both her mother and her neighbor.    Past Medical History  Diagnosis Date  . Asthma   . Sleep apnea   . Headache(784.0)   . Depression     on meds  . Drug abuse 2002  . HSV (herpes simplex virus) infection   . Ovarian cyst   . Muscle spasms of head or neck    Past Surgical History  Procedure Laterality Date  . Cesarean section    . Tubal ligation     Family History  Problem Relation Age of Onset  . Anesthesia problems Neg Hx   . Hypotension Neg Hx   . Malignant hyperthermia Neg Hx   . Pseudochol deficiency Neg Hx   . Diabetes Mother   . Alcohol abuse Father   . Liver disease Father    History  Substance Use Topics  . Smoking status: Never Smoker   . Smokeless tobacco: Never Used  . Alcohol Use: No   OB History    Gravida Para Term Preterm AB TAB SAB Ectopic Multiple Living   5 5 5  0 0 0 0 0 0 5     Review of Systems  10 systems reviewed and found to be negative, except as noted in the HPI.   Allergies  Review of patient's allergies indicates no known allergies.  Home Medications   Prior to Admission medications   Medication Sig Start Date End Date Taking? Authorizing Provider  albuterol (PROVENTIL  HFA;VENTOLIN HFA) 108 (90 BASE) MCG/ACT inhaler Inhale 1 puff into the lungs every 6 (six) hours as needed for wheezing or shortness of breath.   Yes Historical Provider, MD  citalopram (CELEXA) 20 MG tablet Take 20 mg by mouth 2 (two) times daily.    Yes Historical Provider, MD  gemfibrozil (LOPID) 600 MG tablet Take 600 mg by mouth 2 (two) times daily before a meal.  05/06/13  Yes Historical Provider, MD  traZODone (DESYREL) 100 MG tablet Take 100 mg by mouth at bedtime.  07/01/13  Yes Historical Provider, MD  ciprofloxacin (CIPRO) 500 MG tablet Take 1 tablet (500 mg total) by mouth 2 (two) times daily. One po bid x 7 days Patient not taking: Reported on 02/05/2014 07/23/13   Malvin Johns, MD   BP 164/101 mmHg  Pulse 84  Temp(Src) 99.2 F (37.3 C)  Resp 22  SpO2 98%  LMP 07/29/2012 Physical Exam  Constitutional: She is oriented to person, place, and time. She appears well-developed and well-nourished. No distress.  HENT:  Head: Normocephalic.  Proffuse rhinorrhea  No tenderness to palpation of the maxillary or frontal sinuses.  Eyes: Conjunctivae and EOM are normal.  Neck: Normal range of motion. Neck supple.  Cardiovascular: Normal rate, regular rhythm and intact distal pulses.   Pulmonary/Chest: Effort normal and breath sounds  normal. No stridor. No respiratory distress. She has no wheezes. She has no rales. She exhibits no tenderness.  Abdominal: Soft. Bowel sounds are normal. She exhibits no distension and no mass. There is no tenderness. There is no rebound and no guarding.  Musculoskeletal: Normal range of motion.  Neurological: She is alert and oriented to person, place, and time.  Psychiatric: She has a normal mood and affect.  Nursing note and vitals reviewed.   ED Course  Procedures (including critical care time) Labs Review Labs Reviewed  CBC WITH DIFFERENTIAL - Abnormal; Notable for the following:    Hemoglobin 11.9 (*)    All other components within normal limits   BASIC METABOLIC PANEL - Abnormal; Notable for the following:    Sodium 135 (*)    Potassium 3.6 (*)    Glucose, Bld 134 (*)    GFR calc non Af Amer 70 (*)    GFR calc Af Amer 81 (*)    All other components within normal limits    Imaging Review Dg Chest 2 View  02/05/2014   CLINICAL DATA:  Cough and wheezing for 1 day  EXAM: CHEST  2 VIEW  COMPARISON:  07/23/2013  FINDINGS: Cardiomediastinal silhouette is unremarkable. No acute infiltrate or pleural effusion. No pulmonary edema. Minimal perihilar bronchitic changes. Bony thorax is unremarkable.  IMPRESSION: No acute infiltrate or pulmonary edema. Minimal perihilar bronchitic changes.   Electronically Signed   By: Lahoma Crocker M.D.   On: 02/05/2014 17:35     EKG Interpretation None      MDM   Final diagnoses:  URI (upper respiratory infection)  Non-intractable vomiting with nausea, vomiting of unspecified type    Filed Vitals:   02/05/14 1930 02/05/14 1952 02/05/14 2000 02/05/14 2100  BP: 148/97 141/99 141/85 154/98  Pulse: 78 89 89 87  Temp:      TempSrc:      Resp: 16 19 23    SpO2: 93% 99% 100% 97%    Medications  HYDROcodone-acetaminophen (HYCET) 7.5-325 mg/15 ml solution 10 mL (10 mLs Oral Given 02/05/14 1808)  ondansetron (ZOFRAN-ODT) disintegrating tablet 4 mg (4 mg Oral Given 02/05/14 1807)  sodium chloride 0.9 % bolus 1,000 mL (1,000 mLs Intravenous New Bag/Given 02/05/14 2020)  LORazepam (ATIVAN) injection 1 mg (1 mg Intravenous Given 02/05/14 2022)    Tiffany Blake is a 48 y.o. female presenting with Cough and single episode of emesis prior to arrival. Lung sounds are clear to auscultation bilaterally, she is saturating well on room air, she is afebrile, doubt pneumonia.  Patient had single episode of emesis in the ED, after hydrating and giving Ativan she was tolerating by mouth, reporting subjective improvement, amenable to discharge. We have discussed return precautions. Patient has verbalized her  understanding.  Evaluation does not show pathology that would require ongoing emergent intervention or inpatient treatment. Pt is hemodynamically stable and mentating appropriately. Discussed findings and plan with patient/guardian, who agrees with care plan. All questions answered. Return precautions discussed and outpatient follow up given.   Discharge Medication List as of 02/05/2014  9:04 PM    START taking these medications   Details  HYDROcodone-acetaminophen (NORCO/VICODIN) 5-325 MG per tablet Take 1-2 tablets by mouth every 6 hours as needed for pain., Print    promethazine (PHENERGAN) 25 MG tablet Take 1 tablet (25 mg total) by mouth every 6 (six) hours as needed for nausea or vomiting., Starting 02/05/2014, Until Discontinued, Print  Monico Blitz, PA-C 02/05/14 [place], MD 02/05/14 2342

## 2014-02-05 NOTE — ED Notes (Signed)
PT ambulated with baseline gait; VSS; A&Ox3; no signs of distress; respirations even and unlabored; skin warm and dry; no questions upon discharge.  

## 2014-02-05 NOTE — ED Notes (Signed)
Per ems- pt reports she vomited x 1 last night and today she woke up with non productive cough and nasal congestion. Pt reports pain to mid chest when she coughs and deep breathing and palpation. bp 140/80 p-90.

## 2014-04-06 ENCOUNTER — Encounter (HOSPITAL_COMMUNITY): Payer: Self-pay | Admitting: *Deleted

## 2014-04-06 ENCOUNTER — Emergency Department (HOSPITAL_COMMUNITY): Payer: Medicare Other

## 2014-04-06 ENCOUNTER — Emergency Department (HOSPITAL_COMMUNITY)
Admission: EM | Admit: 2014-04-06 | Discharge: 2014-04-06 | Disposition: A | Discharge status: Home / Self Care | Payer: Medicare Other | Attending: Emergency Medicine | Admitting: Emergency Medicine

## 2014-04-06 DIAGNOSIS — J45909 Unspecified asthma, uncomplicated: Secondary | ICD-10-CM | POA: Insufficient documentation

## 2014-04-06 DIAGNOSIS — F329 Major depressive disorder, single episode, unspecified: Secondary | ICD-10-CM | POA: Diagnosis not present

## 2014-04-06 DIAGNOSIS — R0789 Other chest pain: Secondary | ICD-10-CM | POA: Insufficient documentation

## 2014-04-06 DIAGNOSIS — R05 Cough: Secondary | ICD-10-CM | POA: Diagnosis present

## 2014-04-06 DIAGNOSIS — Z79899 Other long term (current) drug therapy: Secondary | ICD-10-CM | POA: Insufficient documentation

## 2014-04-06 DIAGNOSIS — Z8669 Personal history of other diseases of the nervous system and sense organs: Secondary | ICD-10-CM | POA: Diagnosis not present

## 2014-04-06 DIAGNOSIS — Z8739 Personal history of other diseases of the musculoskeletal system and connective tissue: Secondary | ICD-10-CM | POA: Insufficient documentation

## 2014-04-06 DIAGNOSIS — Z8742 Personal history of other diseases of the female genital tract: Secondary | ICD-10-CM | POA: Insufficient documentation

## 2014-04-06 DIAGNOSIS — J069 Acute upper respiratory infection, unspecified: Secondary | ICD-10-CM | POA: Insufficient documentation

## 2014-04-06 DIAGNOSIS — R0602 Shortness of breath: Secondary | ICD-10-CM

## 2014-04-06 DIAGNOSIS — Z8619 Personal history of other infectious and parasitic diseases: Secondary | ICD-10-CM | POA: Insufficient documentation

## 2014-04-06 LAB — CBC WITH DIFFERENTIAL/PLATELET
Basophils Absolute: 0 10*3/uL (ref 0.0–0.1)
Basophils Relative: 0 % (ref 0–1)
EOS PCT: 3 % (ref 0–5)
Eosinophils Absolute: 0.2 10*3/uL (ref 0.0–0.7)
HCT: 41.3 % (ref 36.0–46.0)
Hemoglobin: 13.4 g/dL (ref 12.0–15.0)
LYMPHS ABS: 1.8 10*3/uL (ref 0.7–4.0)
LYMPHS PCT: 26 % (ref 12–46)
MCH: 29.4 pg (ref 26.0–34.0)
MCHC: 32.4 g/dL (ref 30.0–36.0)
MCV: 90.6 fL (ref 78.0–100.0)
MONO ABS: 0.3 10*3/uL (ref 0.1–1.0)
MONOS PCT: 4 % (ref 3–12)
NEUTROS ABS: 4.5 10*3/uL (ref 1.7–7.7)
Neutrophils Relative %: 67 % (ref 43–77)
PLATELETS: 192 10*3/uL (ref 150–400)
RBC: 4.56 MIL/uL (ref 3.87–5.11)
RDW: 12.8 % (ref 11.5–15.5)
WBC: 6.8 10*3/uL (ref 4.0–10.5)

## 2014-04-06 LAB — COMPREHENSIVE METABOLIC PANEL
ALT: 20 U/L (ref 0–35)
AST: 21 U/L (ref 0–37)
Albumin: 3.9 g/dL (ref 3.5–5.2)
Alkaline Phosphatase: 90 U/L (ref 39–117)
Anion gap: 11 (ref 5–15)
BILIRUBIN TOTAL: 0.5 mg/dL (ref 0.3–1.2)
BUN: 12 mg/dL (ref 6–23)
CALCIUM: 9.4 mg/dL (ref 8.4–10.5)
CHLORIDE: 101 meq/L (ref 96–112)
CO2: 27 mmol/L (ref 19–32)
Creatinine, Ser: 0.92 mg/dL (ref 0.50–1.10)
GFR, EST AFRICAN AMERICAN: 85 mL/min — AB (ref >90)
GFR, EST NON AFRICAN AMERICAN: 74 mL/min — AB (ref >90)
Glucose, Bld: 110 mg/dL — ABNORMAL HIGH (ref 70–99)
Potassium: 4.4 mmol/L (ref 3.5–5.1)
SODIUM: 139 mmol/L (ref 135–145)
Total Protein: 7.6 g/dL (ref 6.0–8.3)

## 2014-04-06 LAB — TROPONIN I: Troponin I: <0.03 ng/mL (ref <0.031)

## 2014-04-06 MED ORDER — IPRATROPIUM-ALBUTEROL 0.5-2.5 (3) MG/3ML IN SOLN
3.0000 mL | Freq: Once | RESPIRATORY_TRACT | Status: AC
  Administered 2014-04-06: 3 mL via RESPIRATORY_TRACT
  Filled 2014-04-06: qty 3

## 2014-04-06 MED ORDER — GUAIFENESIN-CODEINE 100-10 MG/5ML PO SOLN: 10.0000 mL | Freq: Three times a day (TID) | ORAL | Status: DC | PRN

## 2014-04-06 MED ORDER — ACETAMINOPHEN 325 MG PO TABS
650.0000 mg | ORAL_TABLET | Freq: Once | ORAL | Status: AC
Start: 2014-04-06 — End: 2014-04-06
  Administered 2014-04-06: 650 mg via ORAL
  Filled 2014-04-06: qty 2

## 2014-04-06 MED ORDER — GUAIFENESIN-CODEINE 100-10 MG/5ML PO SOLN
10.0000 mL | Freq: Once | ORAL | Status: AC
  Administered 2014-04-06: 10 mL via ORAL
  Filled 2014-04-06: qty 10

## 2014-04-06 MED ORDER — ALBUTEROL SULFATE HFA 108 (90 BASE) MCG/ACT IN AERS: 2.0000 | INHALATION_SPRAY | Freq: Four times a day (QID) | RESPIRATORY_TRACT | Status: AC | PRN

## 2014-04-06 NOTE — ED Notes (Signed)
Pt given water per PA 

## 2014-04-06 NOTE — ED Notes (Signed)
EKG performed and given to Dr. Audie Pinto

## 2014-04-06 NOTE — ED Provider Notes (Signed)
CSN: [id number]     Arrival date & time 04/06/14  1204 History   First MD Initiated Contact with Patient 04/06/14 1213     Chief Complaint  Patient presents with  . Cough  . Pleurisy  . Headache     (Consider location/radiation/quality/duration/timing/severity/associated sxs/prior Treatment) HPI Comments: Patient is a 49 yo F PMHx significant for asthma, depression, headaches, drug abuse presenting to the ED for a gradually worsening cough since November. Patient states over the last few days her cough has become productive with yellow sputum, she is having associated posttussive chest tightness and generalized headache. She states she has tried OTC cough medications with no improvement. No modifying factors identified. PERC negative.   Patient is a 49 y.o. female presenting with cough and headaches.  Cough Associated symptoms: headaches and rhinorrhea   Headache Associated symptoms: congestion and cough     Past Medical History  Diagnosis Date  . Asthma   . Sleep apnea   . Headache(784.0)   . Depression     on meds  . Drug abuse 2002  . HSV (herpes simplex virus) infection   . Ovarian cyst   . Muscle spasms of head or neck    Past Surgical History  Procedure Laterality Date  . Cesarean section    . Tubal ligation     Family History  Problem Relation Age of Onset  . Anesthesia problems Neg Hx   . Hypotension Neg Hx   . Malignant hyperthermia Neg Hx   . Pseudochol deficiency Neg Hx   . Diabetes Mother   . Alcohol abuse Father   . Liver disease Father    History  Substance Use Topics  . Smoking status: Never Smoker   . Smokeless tobacco: Never Used  . Alcohol Use: No   OB History    Gravida Para Term Preterm AB TAB SAB Ectopic Multiple Living   5 5 5  0 0 0 0 0 0 5     Review of Systems  HENT: Positive for congestion and rhinorrhea.   Respiratory: Positive for cough and chest tightness.   Neurological: Positive for headaches.  All other systems reviewed and  are negative.     Allergies  Review of patient's allergies indicates no known allergies.  Home Medications   Prior to Admission medications   Medication Sig Start Date End Date Taking? Authorizing Provider  albuterol (PROVENTIL HFA;VENTOLIN HFA) 108 (90 BASE) MCG/ACT inhaler Inhale 1 puff into the lungs every 6 (six) hours as needed for wheezing or shortness of breath.    Historical Provider, MD  albuterol (PROVENTIL HFA;VENTOLIN HFA) 108 (90 BASE) MCG/ACT inhaler Inhale 2 puffs into the lungs every 6 (six) hours as needed for wheezing or shortness of breath. 04/06/14   Jadrien Narine L Libi Corso, PA-C  citalopram (CELEXA) 20 MG tablet Take 20 mg by mouth 2 (two) times daily.     Historical Provider, MD  gemfibrozil (LOPID) 600 MG tablet Take 600 mg by mouth 2 (two) times daily before a meal.  05/06/13   Historical Provider, MD  guaiFENesin-codeine 100-10 MG/5ML syrup Take 10 mLs by mouth 3 (three) times daily as needed for cough. 04/06/14   Sheddrick Lattanzio L Bane Hagy, PA-C  HYDROcodone-acetaminophen (NORCO/VICODIN) 5-325 MG per tablet Take 1-2 tablets by mouth every 6 hours as needed for pain. 02/05/14   Nicole Pisciotta, PA-C  promethazine (PHENERGAN) 25 MG tablet Take 1 tablet (25 mg total) by mouth every 6 (six) hours as needed for nausea or vomiting. 02/05/14  Nicole Pisciotta, PA-C  traZODone (DESYREL) 100 MG tablet Take 100 mg by mouth at bedtime.  07/01/13   Historical Provider, MD   BP 122/62 mmHg  Pulse 85  Temp(Src) 98.5 F (36.9 C) (Oral)  Resp 13  Ht 5\' 2"  (1.575 m)  Wt 265 lb (120.203 kg)  BMI 48.46 kg/m2  SpO2 96%  LMP 07/29/2012 Physical Exam  Constitutional: She is oriented to person, place, and time. She appears well-developed and well-nourished. No distress.  HENT:  Head: Normocephalic and atraumatic.  Right Ear: External ear normal.  Left Ear: External ear normal.  Nose: Nose normal.  Mouth/Throat: Oropharynx is clear and moist. No oropharyngeal exudate.  Eyes:  Conjunctivae are normal.  Neck: Normal range of motion. Neck supple.  Cardiovascular: Normal rate, regular rhythm, normal heart sounds and intact distal pulses.   Pulmonary/Chest: Effort normal and breath sounds normal. No respiratory distress. She exhibits tenderness.  Cough on examination.   Abdominal: Soft. There is no tenderness.  Musculoskeletal: Normal range of motion. She exhibits no edema.  Neurological: She is alert and oriented to person, place, and time.  Skin: Skin is warm and dry. She is not diaphoretic.  Psychiatric: She has a normal mood and affect.  Nursing note and vitals reviewed.   ED Course  Procedures (including critical care time) Medications  ipratropium-albuterol (DUONEB) 0.5-2.5 (3) MG/3ML nebulizer solution 3 mL (3 mLs Nebulization Given 04/06/14 1328)  guaiFENesin-codeine 100-10 MG/5ML solution 10 mL (10 mLs Oral Given 04/06/14 1326)  acetaminophen (TYLENOL) tablet 650 mg (650 mg Oral Given 04/06/14 1426)  ipratropium-albuterol (DUONEB) 0.5-2.5 (3) MG/3ML nebulizer solution 3 mL (3 mLs Nebulization Given 04/06/14 1426)    Labs Review Labs Reviewed  COMPREHENSIVE METABOLIC PANEL - Abnormal; Notable for the following:    Glucose, Bld 110 (*)    GFR calc non Af Amer 74 (*)    GFR calc Af Amer 85 (*)    All other components within normal limits  CBC WITH DIFFERENTIAL  TROPONIN I    Imaging Review Dg Chest 2 View  04/06/2014   CLINICAL DATA:  Shortness of breath, productive cough for 2 months  EXAM: CHEST  2 VIEW  COMPARISON:  02/05/2014  FINDINGS: The heart size and mediastinal contours are within normal limits. Both lungs are clear. The visualized skeletal structures are unremarkable.  IMPRESSION: No active cardiopulmonary disease.   Electronically Signed   By: Kathreen Devoid   On: 04/06/2014 13:42     EKG Interpretation None      Patient endorses improvement after two nebulizer treatments, resting comfortably. Cough improving.   MDM   Final diagnoses:   Upper respiratory infection  Chest wall pain    Filed Vitals:   04/06/14 1451  BP: 122/62  Pulse: 85  Temp:   Resp: 13   Afebrile, NAD, non-toxic appearing, AAOx4.  I have reviewed nursing notes, vital signs, and all appropriate lab and imaging results for this patient. perc negative, VSS, no tracheal deviation, no JVD or new murmur, RRR, breath sounds equal bilaterally, EKG without acute abnormalities, negative troponin, and CXR negative for acute infiltrate. Patients symptoms are consistent with URI, likely viral etiology. Discussed that antibiotics are not indicated for viral infections. Pt will be discharged with symptomatic treatment.  Verbalizes understanding and is agreeable with plan. Pt is hemodynamically stable & in NAD prior to dc. Patient is stable at time of discharge    Harlow Mares, PA-C 04/06/14 1511  Dot Lanes, MD 04/09/14 1226

## 2014-04-06 NOTE — Discharge Instructions (Signed)
Please follow up with your primary care physician in 1-2 days. If you do not have one please call the [facility] number listed above. Please take pain medication and/or muscle relaxants as prescribed and as needed for pain. Please do not drive on narcotic pain medication or on muscle relaxants. Please read all discharge instructions and return precautions.    Chest Wall Pain Chest wall pain is pain in or around the bones and muscles of your chest. It may take up to 6 weeks to get better. It may take longer if you must stay physically active in your work and activities.  CAUSES  Chest wall pain may happen on its own. However, it may be caused by:  A viral illness like the flu.  Injury.  Coughing.  Exercise.  Arthritis.  Fibromyalgia.  Shingles. HOME CARE INSTRUCTIONS   Avoid overtiring physical activity. Try not to strain or perform activities that cause pain. This includes any activities using your chest or your abdominal and side muscles, especially if heavy weights are used.  Put ice on the sore area.  Put ice in a plastic bag.  Place a towel between your skin and the bag.  Leave the ice on for 15-20 minutes per hour while awake for the first 2 days.  Only take over-the-counter or prescription medicines for pain, discomfort, or fever as directed by your caregiver. SEEK IMMEDIATE MEDICAL CARE IF:   Your pain increases, or you are very uncomfortable.  You have a fever.  Your chest pain becomes worse.  You have new, unexplained symptoms.  You have nausea or vomiting.  You feel sweaty or lightheaded.  You have a cough with phlegm (sputum), or you cough up blood. MAKE SURE YOU:   Understand these instructions.  Will watch your condition.  Will get help right away if you are not doing well or get worse. Document Released: 03/12/2005 Document Revised: 06/04/2011 Document Reviewed: 11/06/2010 [facility] Patient Information 2015 [place], [place].  This information is not intended to replace advice given to you by your health care provider. Make sure you discuss any questions you have with your health care provider. Upper Respiratory Infection, Adult An upper respiratory infection (URI) is also sometimes known as the common cold. The upper respiratory tract includes the nose, sinuses, throat, trachea, and bronchi. Bronchi are the airways leading to the lungs. Most people improve within 1 week, but symptoms can last up to 2 weeks. A residual cough may last even longer.  CAUSES Many different viruses can infect the tissues lining the upper respiratory tract. The tissues become irritated and inflamed and often become very moist. Mucus production is also common. A cold is contagious. You can easily spread the virus to others by oral contact. This includes kissing, sharing a glass, coughing, or sneezing. Touching your mouth or nose and then touching a surface, which is then touched by another person, can also spread the virus. SYMPTOMS  Symptoms typically develop 1 to 3 days after you come in contact with a cold virus. Symptoms vary from person to person. They may include:  Runny nose.  Sneezing.  Nasal congestion.  Sinus irritation.  Sore throat.  Loss of voice (laryngitis).  Cough.  Fatigue.  Muscle aches.  Loss of appetite.  Headache.  Low-grade fever. DIAGNOSIS  You might diagnose your own cold based on familiar symptoms, since most people get a cold 2 to 3 times a year. Your caregiver can confirm this based on your exam. Most  importantly, your caregiver can check that your symptoms are not due to another disease such as strep throat, sinusitis, pneumonia, asthma, or epiglottitis. Blood tests, throat tests, and X-rays are not necessary to diagnose a common cold, but they may sometimes be helpful in excluding other more serious diseases. Your caregiver will decide if any further tests are required. RISKS AND COMPLICATIONS  You  may be at risk for a more severe case of the common cold if you smoke cigarettes, have chronic heart disease (such as heart failure) or lung disease (such as asthma), or if you have a weakened immune system. The very young and very old are also at risk for more serious infections. Bacterial sinusitis, middle ear infections, and bacterial pneumonia can complicate the common cold. The common cold can worsen asthma and chronic obstructive pulmonary disease (COPD). Sometimes, these complications can require emergency medical care and may be life-threatening. PREVENTION  The best way to protect against getting a cold is to practice good hygiene. Avoid oral or hand contact with people with cold symptoms. Wash your hands often if contact occurs. There is no clear evidence that vitamin C, vitamin E, echinacea, or exercise reduces the chance of developing a cold. However, it is always recommended to get plenty of rest and practice good nutrition. TREATMENT  Treatment is directed at relieving symptoms. There is no cure. Antibiotics are not effective, because the infection is caused by a virus, not by bacteria. Treatment may include:  Increased fluid intake. Sports drinks offer valuable electrolytes, sugars, and fluids.  Breathing heated mist or steam (vaporizer or shower).  Eating chicken soup or other clear broths, and maintaining good nutrition.  Getting plenty of rest.  Using gargles or lozenges for comfort.  Controlling fevers with ibuprofen or acetaminophen as directed by your caregiver.  Increasing usage of your inhaler if you have asthma. Zinc gel and zinc lozenges, taken in the first 24 hours of the common cold, can shorten the duration and lessen the severity of symptoms. Pain medicines may help with fever, muscle aches, and throat pain. A variety of non-prescription medicines are available to treat congestion and runny nose. Your caregiver can make recommendations and may suggest nasal or lung  inhalers for other symptoms.  HOME CARE INSTRUCTIONS   Only take over-the-counter or prescription medicines for pain, discomfort, or fever as directed by your caregiver.  Use a warm mist humidifier or inhale steam from a shower to increase air moisture. This may keep secretions moist and make it easier to breathe.  Drink enough water and fluids to keep your urine clear or pale yellow.  Rest as needed.  Return to work when your temperature has returned to normal or as your caregiver advises. You may need to stay home longer to avoid infecting others. You can also use a face mask and careful hand washing to prevent spread of the virus. SEEK MEDICAL CARE IF:   After the first few days, you feel you are getting worse rather than better.  You need your caregiver's advice about medicines to control symptoms.  You develop chills, worsening shortness of breath, or brown or red sputum. These may be signs of pneumonia.  You develop yellow or brown nasal discharge or pain in the face, especially when you bend forward. These may be signs of sinusitis.  You develop a fever, swollen neck glands, pain with swallowing, or white areas in the back of your throat. These may be signs of strep throat. [place]  IF:   You have a fever.  You develop severe or persistent headache, ear pain, sinus pain, or chest pain.  You develop wheezing, a prolonged cough, cough up blood, or have a change in your usual mucus (if you have chronic lung disease).  You develop sore muscles or a stiff neck. Document Released: 09/05/2000 Document Revised: 06/04/2011 Document Reviewed: 06/17/2013 [facility] Patient Information 2015 [place], [place]. This information is not intended to replace advice given to you by your health care provider. Make sure you discuss any questions you have with your health care provider.

## 2014-04-06 NOTE — ED Notes (Signed)
Congestion, rt. Lateral facial and head pain; substernal cp with coughing; productive cough - yellowish.

## 2014-06-17 ENCOUNTER — Emergency Department (HOSPITAL_COMMUNITY)
Admission: EM | Admit: 2014-06-17 | Discharge: 2014-06-17 | Disposition: A | Discharge status: Home / Self Care | Payer: Medicare Other | Attending: Emergency Medicine | Admitting: Emergency Medicine

## 2014-06-17 ENCOUNTER — Encounter (HOSPITAL_COMMUNITY): Payer: Self-pay | Admitting: Neurology

## 2014-06-17 ENCOUNTER — Emergency Department (HOSPITAL_COMMUNITY): Payer: Medicare Other

## 2014-06-17 DIAGNOSIS — F329 Major depressive disorder, single episode, unspecified: Secondary | ICD-10-CM | POA: Insufficient documentation

## 2014-06-17 DIAGNOSIS — Z8669 Personal history of other diseases of the nervous system and sense organs: Secondary | ICD-10-CM | POA: Insufficient documentation

## 2014-06-17 DIAGNOSIS — M25571 Pain in right ankle and joints of right foot: Secondary | ICD-10-CM | POA: Diagnosis present

## 2014-06-17 DIAGNOSIS — J45909 Unspecified asthma, uncomplicated: Secondary | ICD-10-CM | POA: Diagnosis not present

## 2014-06-17 DIAGNOSIS — Z8742 Personal history of other diseases of the female genital tract: Secondary | ICD-10-CM | POA: Diagnosis not present

## 2014-06-17 DIAGNOSIS — Z8619 Personal history of other infectious and parasitic diseases: Secondary | ICD-10-CM | POA: Insufficient documentation

## 2014-06-17 DIAGNOSIS — Z79899 Other long term (current) drug therapy: Secondary | ICD-10-CM | POA: Diagnosis not present

## 2014-06-17 MED ORDER — IBUPROFEN 600 MG PO TABS: 600.0000 mg | ORAL_TABLET | Freq: Three times a day (TID) | ORAL | Status: DC | PRN

## 2014-06-17 MED ORDER — IBUPROFEN 400 MG PO TABS
600.0000 mg | ORAL_TABLET | Freq: Once | ORAL | Status: AC
  Administered 2014-06-17: 600 mg via ORAL
  Filled 2014-06-17 (×2): qty 2

## 2014-06-17 NOTE — ED Provider Notes (Signed)
CSN: [id number]     Arrival date & time 06/17/14  1125 History  This chart was scribed for non-physician practitioner, Junius Creamer, NP, working with Pamella Pert, MD by Ladene Artist, ED Scribe. This patient was seen in room TR09C/TR09C and the patient's care was started at 11:51 AM.   Chief Complaint  Patient presents with  . Ankle Pain   The history is provided by the patient. No language interpreter was used.   HPI Comments: Tiffany Blake is a 49 y.o. female, with a h/o asthma, depression, who presents to the Emergency Department complaining of constant R ankle pain for the past 2 months. Pain is exacerbated with bearing weight. Pt has been seen by here PCP FULP, CAMMIE, MD but reports that she has not mentioned ankle pain to her PCP. Pt denies injury. She has been treating with elevation and one dose of Advil last night without relief.   Past Medical History  Diagnosis Date  . Asthma   . Sleep apnea   . Headache(784.0)   . Depression     on meds  . Drug abuse 2002  . HSV (herpes simplex virus) infection   . Ovarian cyst   . Muscle spasms of head or neck    Past Surgical History  Procedure Laterality Date  . Cesarean section    . Tubal ligation     Family History  Problem Relation Age of Onset  . Anesthesia problems Neg Hx   . Hypotension Neg Hx   . Malignant hyperthermia Neg Hx   . Pseudochol deficiency Neg Hx   . Diabetes Mother   . Alcohol abuse Father   . Liver disease Father    History  Substance Use Topics  . Smoking status: Never Smoker   . Smokeless tobacco: Never Used  . Alcohol Use: No   OB History    Gravida Para Term Preterm AB TAB SAB Ectopic Multiple Living   5 5 5  0 0 0 0 0 0 5     Review of Systems  Musculoskeletal: Positive for arthralgias.   Allergies  Review of patient's allergies indicates no known allergies.  Home Medications   Prior to Admission medications   Medication Sig Start Date End Date Taking? Authorizing Provider   albuterol (PROVENTIL HFA;VENTOLIN HFA) 108 (90 BASE) MCG/ACT inhaler Inhale 1 puff into the lungs every 6 (six) hours as needed for wheezing or shortness of breath.    Historical Provider, MD  albuterol (PROVENTIL HFA;VENTOLIN HFA) 108 (90 BASE) MCG/ACT inhaler Inhale 2 puffs into the lungs every 6 (six) hours as needed for wheezing or shortness of breath. 04/06/14   Jennifer Piepenbrink, PA-C  citalopram (CELEXA) 20 MG tablet Take 20 mg by mouth 2 (two) times daily.     Historical Provider, MD  gemfibrozil (LOPID) 600 MG tablet Take 600 mg by mouth 2 (two) times daily before a meal.  05/06/13   Historical Provider, MD  guaiFENesin-codeine 100-10 MG/5ML syrup Take 10 mLs by mouth 3 (three) times daily as needed for cough. 04/06/14   Jennifer Piepenbrink, PA-C  HYDROcodone-acetaminophen (NORCO/VICODIN) 5-325 MG per tablet Take 1-2 tablets by mouth every 6 hours as needed for pain. 02/05/14   Nicole Pisciotta, PA-C  ibuprofen (ADVIL,MOTRIN) 600 MG tablet Take 1 tablet (600 mg total) by mouth every 8 (eight) hours as needed. 06/17/14   Junius Creamer, NP  promethazine (PHENERGAN) 25 MG tablet Take 1 tablet (25 mg total) by mouth every 6 (six) hours as needed for nausea or  vomiting. 02/05/14   Nicole Pisciotta, PA-C  traZODone (DESYREL) 100 MG tablet Take 100 mg by mouth at bedtime.  07/01/13   Historical Provider, MD   BP 150/79 mmHg  Pulse 88  Temp(Src) 98 F (36.7 C)  Resp 18  Ht 5\' 2"  (1.575 m)  SpO2 96%  LMP 07/29/2012 Physical Exam  Constitutional: She is oriented to person, place, and time. She appears well-developed and well-nourished. No distress.  HENT:  Head: Normocephalic and atraumatic.  Eyes: Conjunctivae and EOM are normal.  Neck: Neck supple. No tracheal deviation present.  Cardiovascular: Normal rate.   Pulmonary/Chest: Effort normal. No respiratory distress.  Musculoskeletal: Normal range of motion.  Full ROM of R ankle. No swelling, discoloration or lesions noted. Strong pulse.    Neurological: She is alert and oriented to person, place, and time.  Skin: Skin is warm and dry.  Psychiatric: She has a normal mood and affect. Her behavior is normal.  Nursing note and vitals reviewed.  ED Course  Procedures (including critical care time) DIAGNOSTIC STUDIES: Oxygen Saturation is 96% on RA, normal by my interpretation.    COORDINATION OF CARE: 11:53 AM-Discussed treatment plan which includes XR with pt at bedside and pt agreed to plan.   Labs Review Labs Reviewed - No data to display  Imaging Review Dg Ankle Complete Right  06/17/2014   CLINICAL DATA:  Ankle pain and swelling, unable to bear weight, dorsal foot swelling, no known injury  EXAM: RIGHT ANKLE - COMPLETE 3+ VIEW  COMPARISON:  None.  FINDINGS: Three views of the right ankle submitted. No acute fracture or subluxation. Ankle mortise is preserved. There is large plantar spur of calcaneus. Mild dorsal spurring tarsal region.  IMPRESSION: No acute fracture or subluxation. Plantar spur of calcaneus. Mild dorsal spurring tarsal region.   Electronically Signed   By: Lahoma Crocker M.D.   On: 06/17/2014 13:26     EKG Interpretation None     Ask her his normal.  Patient has been placed in ASO for support MDM   Final diagnoses:  Ankle pain, right     I personally performed the services described in this documentation, which was scribed in my presence. The recorded information has been reviewed and is accurate.    Junius Creamer, NP 06/17/14 [place], MD 06/18/14 [phone]

## 2014-06-17 NOTE — Discharge Instructions (Signed)
Ankle Pain Ankle pain is a common symptom. The bones, cartilage, tendons, and muscles of the ankle joint perform a lot of work each day. The ankle joint holds your body weight and allows you to move around. Ankle pain can occur on either side or back of 1 or both ankles. Ankle pain may be sharp and burning or dull and aching. There may be tenderness, stiffness, redness, or warmth around the ankle. The pain occurs more often when a person walks or puts pressure on the ankle. CAUSES  There are many reasons ankle pain can develop. It is important to work with your caregiver to identify the cause since many conditions can impact the bones, cartilage, muscles, and tendons. Causes for ankle pain include:  Injury, including a break (fracture), sprain, or strain often due to a fall, sports, or a high-impact activity.  Swelling (inflammation) of a tendon (tendonitis).  Achilles tendon rupture.  Ankle instability after repeated sprains and strains.  Poor foot alignment.  Pressure on a nerve (tarsal tunnel syndrome).  Arthritis in the ankle or the lining of the ankle.  Crystal formation in the ankle (gout or pseudogout). DIAGNOSIS  A diagnosis is based on your medical history, your symptoms, results of your physical exam, and results of diagnostic tests. Diagnostic tests may include X-ray exams or a computerized magnetic scan (magnetic resonance imaging, MRI). TREATMENT  Treatment will depend on the cause of your ankle pain and may include:  Keeping pressure off the ankle and limiting activities.  Using crutches or other walking support (a cane or brace).  Using rest, ice, compression, and elevation.  Participating in physical therapy or home exercises.  Wearing shoe inserts or special shoes.  Losing weight.  Taking medications to reduce pain or swelling or receiving an injection.  Undergoing surgery. HOME CARE INSTRUCTIONS   Only take over-the-counter or prescription medicines for  pain, discomfort, or fever as directed by your caregiver.  Put ice on the injured area.  Put ice in a plastic bag.  Place a towel between your skin and the bag.  Leave the ice on for 15-20 minutes at a time, 03-04 times a day.  Keep your leg raised (elevated) when possible to lessen swelling.  Avoid activities that cause ankle pain.  Follow specific exercises as directed by your caregiver.  Record how often you have ankle pain, the location of the pain, and what it feels like. This information may be helpful to you and your caregiver.  Ask your caregiver about returning to work or sports and whether you should drive.  Follow up with your caregiver for further examination, therapy, or testing as directed. SEEK MEDICAL CARE IF:   Pain or swelling continues or worsens beyond 1 week.  You have an oral temperature above 102 F (38.9 C).  You are feeling unwell or have chills.  You are having an increasingly difficult time with walking.  You have loss of sensation or other new symptoms.  You have questions or concerns. MAKE SURE YOU:   Understand these instructions.  Will watch your condition.  Will get help right away if you are not doing well or get worse. Document Released: 08/30/2009 Document Revised: 06/04/2011 Document Reviewed: 08/30/2009 [facility] Patient Information 2015 [place], [place]. This information is not intended to replace advice given to you by your health care provider. Make sure you discuss any questions you have with your health care provider. Your ankle x-ray is normal.  You have been placed in a brace for  pain control.  Please follow-up with your doctor

## 2014-06-17 NOTE — ED Notes (Signed)
Pt reports 2 months ago her right ankle started hurting when she was walking, has continued to hurt when she walks but no injury. Today got tired of having pain.

## 2014-06-17 NOTE — ED Notes (Signed)
Declined W/C at D/C and was escorted to lobby by RN. 

## 2014-10-11 ENCOUNTER — Inpatient Hospital Stay (HOSPITAL_COMMUNITY)
Admission: AD | Admit: 2014-10-11 | Discharge: 2014-10-11 | Disposition: A | Discharge status: Home / Self Care | Payer: Medicare Other | Source: Ambulatory Visit | Attending: Obstetrics & Gynecology | Admitting: Obstetrics & Gynecology

## 2014-10-11 ENCOUNTER — Encounter (HOSPITAL_COMMUNITY): Payer: Self-pay | Admitting: *Deleted

## 2014-10-11 DIAGNOSIS — Z3202 Encounter for pregnancy test, result negative: Secondary | ICD-10-CM | POA: Insufficient documentation

## 2014-10-11 DIAGNOSIS — Z3200 Encounter for pregnancy test, result unknown: Secondary | ICD-10-CM | POA: Diagnosis present

## 2014-10-11 LAB — WET PREP, GENITAL
CLUE CELLS WET PREP: NONE SEEN
TRICH WET PREP: NONE SEEN
YEAST WET PREP: NONE SEEN

## 2014-10-11 LAB — URINALYSIS, ROUTINE W REFLEX MICROSCOPIC
BILIRUBIN URINE: NEGATIVE
Glucose, UA: NEGATIVE mg/dL
Ketones, ur: NEGATIVE mg/dL
LEUKOCYTES UA: NEGATIVE
Nitrite: NEGATIVE
Protein, ur: NEGATIVE mg/dL
SPECIFIC GRAVITY, URINE: 1.02 (ref 1.005–1.030)
Urobilinogen, UA: 0.2 mg/dL (ref 0.0–1.0)
pH: 7 (ref 5.0–8.0)

## 2014-10-11 LAB — URINE MICROSCOPIC-ADD ON

## 2014-10-11 LAB — ABO/RH: ABO/RH(D): A NEG

## 2014-10-11 LAB — HCG, QUANTITATIVE, PREGNANCY: hCG, Beta Chain, Quant, S: <1 m[IU]/mL (ref <5)

## 2014-10-11 LAB — POCT PREGNANCY, URINE: Preg Test, Ur: NEGATIVE

## 2014-10-11 MED ORDER — KETOROLAC TROMETHAMINE 60 MG/2ML IM SOLN
60.0000 mg | Freq: Once | INTRAMUSCULAR | Status: AC
  Administered 2014-10-11: 60 mg via INTRAMUSCULAR

## 2014-10-11 MED ORDER — KETOROLAC TROMETHAMINE 60 MG/2ML IM SOLN
INTRAMUSCULAR | Status: AC
  Administered 2014-10-11: 60 mg via INTRAMUSCULAR
  Filled 2014-10-11: qty 2

## 2014-10-11 NOTE — MAU Provider Note (Signed)
History     CSN: [id number]  Arrival date and time: 10/11/14 1644   None     No chief complaint on file.  HPI   [name] is a 49 y.o. female [id number] presenting to MAU for a pregnancy test. She had a positive pregnancy test at home; she went to her primary care Dr. And her pregnancy test was negative. The patient is unable to state when and where she went to the dr. She states that she saw a Dr. Parks Ranger told her the pregnancy test was negative.   The patient says she is craving picks, ice cream, tuna and would like an [place] due to her cravings.  She has had off and on spotting for a couple of weeks.  Patient says she had an ablation years ago and has not had bleeding since then.     OB History    Gravida Para Term Preterm AB TAB SAB Ectopic Multiple Living   5 5 5  0 0 0 0 0 0 5      Past Medical History  Diagnosis Date  . Asthma   . Sleep apnea   . Headache(784.0)   . Depression     on meds  . Drug abuse 2002  . HSV (herpes simplex virus) infection   . Ovarian cyst   . Muscle spasms of head or neck     Past Surgical History  Procedure Laterality Date  . Cesarean section    . Tubal ligation      Family History  Problem Relation Age of Onset  . Anesthesia problems Neg Hx   . Hypotension Neg Hx   . Malignant hyperthermia Neg Hx   . Pseudochol deficiency Neg Hx   . Diabetes Mother   . Alcohol abuse Father   . Liver disease Father     History  Substance Use Topics  . Smoking status: Never Smoker   . Smokeless tobacco: Never Used  . Alcohol Use: No    Allergies: No Known Allergies  Prescriptions prior to admission  Medication Sig Dispense Refill Last Dose  . albuterol (PROVENTIL HFA;VENTOLIN HFA) 108 (90 BASE) MCG/ACT inhaler Inhale 1 puff into the lungs every 6 (six) hours as needed for wheezing or shortness of breath.   unknown  . albuterol (PROVENTIL HFA;VENTOLIN HFA) 108 (90 BASE) MCG/ACT inhaler Inhale 2 puffs into the lungs every 6 (six) hours as  needed for wheezing or shortness of breath. 1 Inhaler 2   . citalopram (CELEXA) 20 MG tablet Take 20 mg by mouth 2 (two) times daily.    02/05/2014 at Unknown time  . gemfibrozil (LOPID) 600 MG tablet Take 600 mg by mouth 2 (two) times daily before a meal.    02/05/2014 at Unknown time  . guaiFENesin-codeine 100-10 MG/5ML syrup Take 10 mLs by mouth 3 (three) times daily as needed for cough. 120 mL 0   . HYDROcodone-acetaminophen (NORCO/VICODIN) 5-325 MG per tablet Take 1-2 tablets by mouth every 6 hours as needed for pain. 15 tablet 0   . ibuprofen (ADVIL,MOTRIN) 600 MG tablet Take 1 tablet (600 mg total) by mouth every 8 (eight) hours as needed. 30 tablet 0   . promethazine (PHENERGAN) 25 MG tablet Take 1 tablet (25 mg total) by mouth every 6 (six) hours as needed for nausea or vomiting. 12 tablet 0   . traZODone (DESYREL) 100 MG tablet Take 100 mg by mouth at bedtime.    02/04/2014 at Unknown time   Results for orders  placed or performed during the hospital encounter of 10/11/14 (from the past 24 hour(s))  Urinalysis, Routine w reflex microscopic (not at [facility])     Status: Abnormal   Collection Time: 10/11/14  6:15 PM  Result Value Ref Range   Color, Urine YELLOW YELLOW   APPearance CLEAR CLEAR   Specific Gravity, Urine 1.020 1.005 - 1.030   pH 7.0 5.0 - 8.0   Glucose, UA NEGATIVE NEGATIVE mg/dL   Hgb urine dipstick SMALL (A) NEGATIVE   Bilirubin Urine NEGATIVE NEGATIVE   Ketones, ur NEGATIVE NEGATIVE mg/dL   Protein, ur NEGATIVE NEGATIVE mg/dL   Urobilinogen, UA 0.2 0.0 - 1.0 mg/dL   Nitrite NEGATIVE NEGATIVE   Leukocytes, UA NEGATIVE NEGATIVE  Urine microscopic-add on     Status: Abnormal   Collection Time: 10/11/14  6:15 PM  Result Value Ref Range   Squamous Epithelial / LPF FEW (A) RARE   WBC, UA 0-2 <3 WBC/hpf   RBC / HPF 0-2 <3 RBC/hpf  Pregnancy, urine POC     Status: None   Collection Time: 10/11/14  6:37 PM  Result Value Ref Range   Preg Test, Ur NEGATIVE NEGATIVE      Review of Systems  Constitutional: Negative for fever and chills.  Gastrointestinal: Positive for nausea, vomiting, abdominal pain (Upper and lower abdominal pain.. It changes from time to time. ) and diarrhea. Negative for constipation.   Physical Exam   Last menstrual period 07/29/2012.  Physical Exam  Constitutional: She is oriented to person, place, and time. She appears well-developed and well-nourished.  GI: There is no tenderness. There is no rigidity, no rebound and no guarding.  Genitourinary:  Speculum exam: Vagina - Small amount of mucus like discharge, no odor Cervix - + scant amount of contact blood on swab Bimanual exam: Cervix closed Uterus non tender, normal size Adnexa non tender, no masses bilaterally GC/Chlam, wet prep done Chaperone present for exam.  Musculoskeletal: Normal range of motion.  Neurological: She is alert and oriented to person, place, and time.  Psychiatric: Her affect is labile. Her speech is delayed. She is slowed.    MAU Course  Procedures  None  MDM Toradol 60 mg IM .  HCG: patient says she had a positive home pregnancy test; pregnancy test in MAU was negative. Will proceed with beta hcg.   Report given to D. Kellie Simmering CNM who resumes care of the patient. Awaiting lab results.   Lezlie Lye, NP   Assessment and Plan    Wet prep neg, quant hcg neg will d/c home

## 2014-10-11 NOTE — MAU Note (Signed)
Pt took home preg test x2, positive. Went to MD office, states pre test is neg. Cramping, spotting,N/V. Noticed white discharge.

## 2014-10-12 LAB — GC/CHLAMYDIA PROBE AMP (~~LOC~~) NOT AT ARMC
CHLAMYDIA, DNA PROBE: NEGATIVE
Neisseria Gonorrhea: NEGATIVE

## 2015-03-31 ENCOUNTER — Emergency Department (HOSPITAL_COMMUNITY): Payer: Medicare Other

## 2015-03-31 ENCOUNTER — Emergency Department (HOSPITAL_COMMUNITY)
Admission: EM | Admit: 2015-03-31 | Discharge: 2015-03-31 | Disposition: A | Discharge status: Home / Self Care | Payer: Medicare Other | Attending: Emergency Medicine | Admitting: Emergency Medicine

## 2015-03-31 ENCOUNTER — Encounter (HOSPITAL_COMMUNITY): Payer: Self-pay | Admitting: *Deleted

## 2015-03-31 DIAGNOSIS — R1084 Generalized abdominal pain: Secondary | ICD-10-CM | POA: Insufficient documentation

## 2015-03-31 DIAGNOSIS — R1013 Epigastric pain: Secondary | ICD-10-CM

## 2015-03-31 DIAGNOSIS — Z9851 Tubal ligation status: Secondary | ICD-10-CM | POA: Diagnosis not present

## 2015-03-31 DIAGNOSIS — Z3202 Encounter for pregnancy test, result negative: Secondary | ICD-10-CM | POA: Diagnosis not present

## 2015-03-31 DIAGNOSIS — J45909 Unspecified asthma, uncomplicated: Secondary | ICD-10-CM | POA: Insufficient documentation

## 2015-03-31 DIAGNOSIS — F329 Major depressive disorder, single episode, unspecified: Secondary | ICD-10-CM | POA: Diagnosis not present

## 2015-03-31 DIAGNOSIS — Z8742 Personal history of other diseases of the female genital tract: Secondary | ICD-10-CM | POA: Insufficient documentation

## 2015-03-31 DIAGNOSIS — Z8669 Personal history of other diseases of the nervous system and sense organs: Secondary | ICD-10-CM | POA: Insufficient documentation

## 2015-03-31 DIAGNOSIS — Z8619 Personal history of other infectious and parasitic diseases: Secondary | ICD-10-CM | POA: Diagnosis not present

## 2015-03-31 DIAGNOSIS — N939 Abnormal uterine and vaginal bleeding, unspecified: Secondary | ICD-10-CM | POA: Diagnosis present

## 2015-03-31 DIAGNOSIS — Z79899 Other long term (current) drug therapy: Secondary | ICD-10-CM | POA: Diagnosis not present

## 2015-03-31 LAB — COMPREHENSIVE METABOLIC PANEL
ALK PHOS: 59 U/L (ref 38–126)
ALT: 24 U/L (ref 14–54)
ANION GAP: 7 (ref 5–15)
AST: 21 U/L (ref 15–41)
Albumin: 3.4 g/dL — ABNORMAL LOW (ref 3.5–5.0)
BILIRUBIN TOTAL: 0.7 mg/dL (ref 0.3–1.2)
BUN: 8 mg/dL (ref 6–20)
CO2: 29 mmol/L (ref 22–32)
Calcium: 9.4 mg/dL (ref 8.9–10.3)
Chloride: 101 mmol/L (ref 101–111)
Creatinine, Ser: 0.9 mg/dL (ref 0.44–1.00)
GFR calc Af Amer: >60 mL/min (ref >60)
Glucose, Bld: 121 mg/dL — ABNORMAL HIGH (ref 65–99)
POTASSIUM: 3.9 mmol/L (ref 3.5–5.1)
Sodium: 137 mmol/L (ref 135–145)
TOTAL PROTEIN: 7.3 g/dL (ref 6.5–8.1)

## 2015-03-31 LAB — WET PREP, GENITAL
Clue Cells Wet Prep HPF POC: NONE SEEN
SPERM: NONE SEEN
Trich, Wet Prep: NONE SEEN
Yeast Wet Prep HPF POC: NONE SEEN

## 2015-03-31 LAB — URINALYSIS, ROUTINE W REFLEX MICROSCOPIC
Bilirubin Urine: NEGATIVE
Glucose, UA: NEGATIVE mg/dL
KETONES UR: NEGATIVE mg/dL
NITRITE: NEGATIVE
Protein, ur: NEGATIVE mg/dL
SPECIFIC GRAVITY, URINE: 1.02 (ref 1.005–1.030)
pH: 6 (ref 5.0–8.0)

## 2015-03-31 LAB — CBC
HCT: 41 % (ref 36.0–46.0)
HEMOGLOBIN: 12.7 g/dL (ref 12.0–15.0)
MCH: 28.9 pg (ref 26.0–34.0)
MCHC: 31 g/dL (ref 30.0–36.0)
MCV: 93.4 fL (ref 78.0–100.0)
Platelets: 194 10*3/uL (ref 150–400)
RBC: 4.39 MIL/uL (ref 3.87–5.11)
RDW: 12.2 % (ref 11.5–15.5)
WBC: 6 10*3/uL (ref 4.0–10.5)

## 2015-03-31 LAB — POC URINE PREG, ED: PREG TEST UR: NEGATIVE

## 2015-03-31 LAB — URINE MICROSCOPIC-ADD ON

## 2015-03-31 MED ORDER — OMEPRAZOLE 20 MG PO CPDR: 20.0000 mg | DELAYED_RELEASE_CAPSULE | Freq: Every day | ORAL | Status: DC

## 2015-03-31 MED ORDER — KETOROLAC TROMETHAMINE 30 MG/ML IJ SOLN
30.0000 mg | Freq: Once | INTRAMUSCULAR | Status: AC
  Administered 2015-03-31: 30 mg via INTRAMUSCULAR
  Filled 2015-03-31: qty 1

## 2015-03-31 NOTE — ED Provider Notes (Signed)
CSN: [id number]     Arrival date & time 03/31/15  0542 History   First MD Initiated Contact with Patient 03/31/15 [phone]     Chief Complaint  Patient presents with  . Back Pain  . Vaginal Bleeding  . Abdominal Pain   (Consider location/radiation/quality/duration/timing/severity/associated sxs/prior Treatment) HPI 50 y.o. female with a hx of tubal ligation, presents to the Emergency Department today complaining abdominal pain since yesterday after having intercourse. Notes the pain as a 10/10 that is mainly located suprapubic and epigastrically. According to the patient, the vaginal bleeding began last week. States this is unusual for her because her LMP was several years ago. She is not on any OCPs. Has been wiping with tissues daily and seeing "a lot of blood." When questioned further, she states that she fills an entire tissue. No fevers, no nausea, vomiting, diarrhea. Last BM was Tuesday. No other symptoms reported.   Past Medical History  Diagnosis Date  . Asthma   . Sleep apnea   . Headache(784.0)   . Depression     on meds  . Drug abuse 2002  . HSV (herpes simplex virus) infection   . Ovarian cyst   . Muscle spasms of head or neck    Past Surgical History  Procedure Laterality Date  . Cesarean section    . Tubal ligation     Family History  Problem Relation Age of Onset  . Anesthesia problems Neg Hx   . Hypotension Neg Hx   . Malignant hyperthermia Neg Hx   . Pseudochol deficiency Neg Hx   . Diabetes Mother   . Alcohol abuse Father   . Liver disease Father    Social History  Substance Use Topics  . Smoking status: Never Smoker   . Smokeless tobacco: Never Used  . Alcohol Use: No   OB History    Gravida Para Term Preterm AB TAB SAB Ectopic Multiple Living   5 5 5  0 0 0 0 0 0 5     Review of Systems  Constitutional: Negative for fever, chills, diaphoresis and fatigue.  HENT: Negative for congestion, sinus pressure, sore throat and tinnitus.   Eyes: Negative for  visual disturbance.  Respiratory: Negative for cough and shortness of breath.   Cardiovascular: Negative for chest pain.  Gastrointestinal: Positive for abdominal pain. Negative for nausea, vomiting, diarrhea and constipation.  Endocrine: Negative for cold intolerance and heat intolerance.  Genitourinary: Positive for vaginal bleeding. Negative for dysuria, hematuria and vaginal pain.  Musculoskeletal: Negative for back pain.  Skin: Negative for color change.  Neurological: Negative for dizziness, syncope, weakness, numbness and headaches.   Allergies  Review of patient's allergies indicates no known allergies.  Home Medications   Prior to Admission medications   Medication Sig Start Date End Date Taking? Authorizing Provider  albuterol (PROVENTIL HFA;VENTOLIN HFA) 108 (90 BASE) MCG/ACT inhaler Inhale 2 puffs into the lungs every 6 (six) hours as needed for wheezing or shortness of breath. 04/06/14   Jennifer Piepenbrink, PA-C  FLUoxetine (PROZAC) 20 MG capsule Take 40 mg by mouth daily.    Historical Provider, MD  metFORMIN (GLUCOPHAGE) 500 MG tablet Take 500 mg by mouth 2 (two) times daily with a meal.    Historical Provider, MD  promethazine (PHENERGAN) 25 MG tablet Take 1 tablet (25 mg total) by mouth every 6 (six) hours as needed for nausea or vomiting. 02/05/14   Nicole Pisciotta, PA-C  topiramate (TOPAMAX) 50 MG tablet Take 50 mg by mouth 2 (  two) times daily.    Historical Provider, MD  traZODone (DESYREL) 100 MG tablet Take 100 mg by mouth at bedtime.  07/01/13   Historical Provider, MD   BP 166/80 mmHg  Pulse 77  Temp(Src) 97.8 F (36.6 C) (Oral)  Resp 20  SpO2 100%  LMP 07/29/2012   Physical Exam  Constitutional: She is oriented to person, place, and time. Vital signs are normal. She appears well-developed and well-nourished.  HENT:  Head: Normocephalic and atraumatic.  Right Ear: Hearing, tympanic membrane, external ear and ear canal normal.  Left Ear: Hearing, tympanic  membrane, external ear and ear canal normal.  Nose: Nose normal.  Mouth/Throat: Uvula is midline, oropharynx is clear and moist and mucous membranes are normal.  Eyes: Conjunctivae, EOM and lids are normal. Pupils are equal, round, and reactive to light.  Neck: Trachea normal and normal range of motion. Neck supple.  Cardiovascular: Normal rate, regular rhythm, S1 normal, S2 normal, normal heart sounds, intact distal pulses and normal pulses.   Pulmonary/Chest: Effort normal and breath sounds normal.  Abdominal: Soft. Normal appearance and bowel sounds are normal. There is tenderness in the epigastric area and suprapubic area. There is no rebound, no CVA tenderness, no tenderness at McBurney's point and negative Murphy's sign.  Genitourinary: Vagina normal. Pelvic exam was performed with patient supine. Cervix exhibits no motion tenderness. Discharge: Bleeding. Right adnexum displays no mass, no tenderness and no fullness. Left adnexum displays no mass, no tenderness and no fullness.  A chaperone was present during the pelvic examination  Lymphadenopathy:    She has no cervical adenopathy.  Neurological: She is alert and oriented to person, place, and time.  Skin: Skin is warm and dry.  Psychiatric: She has a normal mood and affect. Her speech is normal and behavior is normal. Thought content normal.  Vitals reviewed.  ED Course  Procedures (including critical care time) Labs Review Labs Reviewed  WET PREP, GENITAL - Abnormal; Notable for the following:    WBC, Wet Prep HPF POC FEW (*)    All other components within normal limits  COMPREHENSIVE METABOLIC PANEL - Abnormal; Notable for the following:    Glucose, Bld 121 (*)    Albumin 3.4 (*)    All other components within normal limits  URINALYSIS, ROUTINE W REFLEX MICROSCOPIC (NOT AT [facility]) - Abnormal; Notable for the following:    APPearance TURBID (*)    Hgb urine dipstick LARGE (*)    Leukocytes, UA SMALL (*)    All other components  within normal limits  URINE MICROSCOPIC-ADD ON - Abnormal; Notable for the following:    Squamous Epithelial / LPF TOO NUMEROUS TO COUNT (*)    Bacteria, UA MANY (*)    All other components within normal limits  URINE CULTURE  CBC  POC URINE PREG, ED  GC/CHLAMYDIA PROBE AMP ([place]) NOT AT [facility]   Imaging Review US Transvaginal Non-ob  03/31/2015  CLINICAL DATA:  One day of vaginal bleeding, history of bilateral tubal ligation and Cesarean section; onset of last menstrual period yesterday. EXAM: TRANSABDOMINAL AND TRANSVAGINAL ULTRASOUND OF PELVIS TECHNIQUE: Both transabdominal and transvaginal ultrasound examinations of the pelvis were performed. Transabdominal technique was performed for global imaging of the pelvis including uterus, ovaries, adnexal regions, and pelvic cul-de-sac. It was necessary to proceed with endovaginal exam following the transabdominal exam to visualize the uterus, endometrium, and ovaries. COMPARISON:  Pelvic  ultrasound dated April 09, 2011 FINDINGS: Uterus Measurements: 11.2 x 5.0 x 5.3 cm.  No fibroids or other mass visualized. Endometrium Thickness: 12.0 mm.  No focal abnormality visualized. The ovaries could not be demonstrated. Other findings There is no free pelvic fluid. The patient noted pelvic tenderness during the transvaginal portion of the study. IMPRESSION: 1. Normal appearance of the uterus and endometrium. 2. Nonvisualization of the ovaries. No adnexal masses or free fluid are observed. Electronically Signed   By: David  Martinique M.D.   On: 03/31/2015 10:13   US Pelvis Complete  03/31/2015  CLINICAL DATA:  One day of vaginal bleeding, history of bilateral tubal ligation and Cesarean section; onset of last menstrual period yesterday. EXAM: TRANSABDOMINAL AND TRANSVAGINAL ULTRASOUND OF PELVIS TECHNIQUE: Both transabdominal and transvaginal ultrasound examinations of the pelvis were performed. Transabdominal technique was performed for global imaging of the  pelvis including uterus, ovaries, adnexal regions, and pelvic cul-de-sac. It was necessary to proceed with endovaginal exam following the transabdominal exam to visualize the uterus, endometrium, and ovaries. COMPARISON:  Pelvic  ultrasound dated April 09, 2011 FINDINGS: Uterus Measurements: 11.2 x 5.0 x 5.3 cm. No fibroids or other mass visualized. Endometrium Thickness: 12.0 mm.  No focal abnormality visualized. The ovaries could not be demonstrated. Other findings There is no free pelvic fluid. The patient noted pelvic tenderness during the transvaginal portion of the study. IMPRESSION: 1. Normal appearance of the uterus and endometrium. 2. Nonvisualization of the ovaries. No adnexal masses or free fluid are observed. Electronically Signed   By: David  Martinique M.D.   On: 03/31/2015 10:13   US Abdomen Limited Ruq  03/31/2015  CLINICAL DATA:  Generalized abdominal pain for 1 day EXAM: US ABDOMEN LIMITED - RIGHT UPPER QUADRANT COMPARISON:  Supine abdominal film of January 13, 2015 FINDINGS: Visualization of the abdominal structures is limited due to the patient's body habitus. Gallbladder: No gallstones or wall thickening visualized. No sonographic Murphy sign noted by sonographer. Common bile duct: Diameter: 6.6 mm.  No common bile duct stones are observed. Liver: The hepatic echotexture is mildly increased. There is no focal mass nor ductal dilation. IMPRESSION: Normal limited right upper quadrant ultrasound. Mildly increased hepatic echotexture may reflect fatty infiltrative change. Electronically Signed   By: David  Martinique M.D.   On: 03/31/2015 10:15   I have personally reviewed and evaluated these images and lab results as part of my medical decision-making.   EKG Interpretation None      MDM  I have reviewed relevant laboratory values. I have reviewed relevant imaging studies.  I have reviewed the relevant previous healthcare records. I obtained HPI from historian.  ED Course: Pelvic  Exam TransVaginal Ultrasound and Gallbladder US  Assessment: 49yF presents with vaginal bleeding as well as epigastric abdominal pain. On exam, pt was able to ambulate and move around without much discomfort. Pt appeared in NAD. VS stable. She has been see in the past by GYN on 10/11/14 with vaginal bleeding and similar symptoms of abdominal pain. Preg neg, HCG Quant neg at that time. Will evaluate with transvaginal ultrasound, RUQ [place], and Pelvic exam to see if there is a suspect Torsion or Ectopic due to hx of tubal ligation. Epigastric pain most likely reflux like symptoms and will treat accordingly. Will have patient follow up with PCP and GYN for management of vaginal bleeding.   Disposition/Plan:  DC Home w/ Omeprazole Additional Verbal discharge instructions given and discussed with patient.  Pt Instructed to f/u with PCP and GYN in the next 48 hours for evaluation and treatment of symptoms. Pt acknowledges and  agrees with plan.   Supervising Physician Forde Dandy, MD   Final diagnoses:  Vaginal bleeding  Generalized abdominal pain  Epigastric pain       Shary Decamp, PA-C 03/31/15 1640  Forde Dandy, MD 04/01/15 [phone]

## 2015-03-31 NOTE — ED Notes (Addendum)
Pt up to bathroom. Lab called to draw.

## 2015-03-31 NOTE — Discharge Instructions (Signed)
Please read and follow all provided instructions.  Your diagnoses today include:  1. Vaginal bleeding   2. Generalized abdominal pain   3. Epigastric pain    Tests performed today include:  Vital signs. See below for your results today.   Medications prescribed:   Omeprazole (take as prescribed)   Home care instructions:  Follow any educational materials contained in this packet.  Follow-up instructions: Please follow-up with your primary care provider in the next 48 hours for further evaluation of symptoms and treatment of the abdominal pain. You can also follow up with your GYN doctor for the vaginal bleeding.    Return instructions:   Please return to the Emergency Department if you do not get better, if you get worse, or new symptoms OR  - Fever (temperature greater than 101.66F)  - Bleeding that does not stop with holding pressure to the area    -Severe pain (please note that you may be more sore the day after your accident)  - Chest Pain  - Difficulty breathing  - Severe nausea or vomiting  - Inability to tolerate food and liquids  - Passing out  - Skin becoming red around your wounds  - Change in mental status (confusion or lethargy)  - New numbness or weakness     Please return if you have any other emergent concerns.  Additional Information:  Your vital signs today were: BP 143/93 mmHg   Pulse 73   Temp(Src) 97.8 F (36.6 C) (Oral)   Resp 20   SpO2 96%   LMP 07/29/2012 If your blood pressure (BP) was elevated above 135/85 this visit, please have this repeated by your doctor within one month. ---------------

## 2015-03-31 NOTE — ED Notes (Addendum)
Pt c/o vaginal bleeding onset yesterday with lower abdominal pain; also reports headache, and back pain

## 2015-03-31 NOTE — ED Notes (Signed)
Pt ambulatory to room from triage with steady gait

## 2015-04-01 LAB — GC/CHLAMYDIA PROBE AMP (~~LOC~~) NOT AT ARMC
Chlamydia: NEGATIVE
Neisseria Gonorrhea: NEGATIVE

## 2015-04-01 LAB — URINE CULTURE

## 2015-04-18 ENCOUNTER — Encounter (HOSPITAL_COMMUNITY): Payer: Self-pay | Admitting: Emergency Medicine

## 2015-04-18 ENCOUNTER — Emergency Department (HOSPITAL_COMMUNITY)
Admission: EM | Admit: 2015-04-18 | Discharge: 2015-04-18 | Disposition: A | Discharge status: Home / Self Care | Payer: Medicare Other | Attending: Emergency Medicine | Admitting: Emergency Medicine

## 2015-04-18 DIAGNOSIS — Z8669 Personal history of other diseases of the nervous system and sense organs: Secondary | ICD-10-CM | POA: Insufficient documentation

## 2015-04-18 DIAGNOSIS — J45909 Unspecified asthma, uncomplicated: Secondary | ICD-10-CM | POA: Insufficient documentation

## 2015-04-18 DIAGNOSIS — Z79899 Other long term (current) drug therapy: Secondary | ICD-10-CM | POA: Diagnosis not present

## 2015-04-18 DIAGNOSIS — R5381 Other malaise: Secondary | ICD-10-CM | POA: Insufficient documentation

## 2015-04-18 DIAGNOSIS — Z8742 Personal history of other diseases of the female genital tract: Secondary | ICD-10-CM | POA: Diagnosis not present

## 2015-04-18 DIAGNOSIS — R197 Diarrhea, unspecified: Secondary | ICD-10-CM | POA: Insufficient documentation

## 2015-04-18 DIAGNOSIS — Z8619 Personal history of other infectious and parasitic diseases: Secondary | ICD-10-CM | POA: Insufficient documentation

## 2015-04-18 DIAGNOSIS — Z7984 Long term (current) use of oral hypoglycemic drugs: Secondary | ICD-10-CM | POA: Diagnosis not present

## 2015-04-18 DIAGNOSIS — Z8739 Personal history of other diseases of the musculoskeletal system and connective tissue: Secondary | ICD-10-CM | POA: Diagnosis not present

## 2015-04-18 DIAGNOSIS — R112 Nausea with vomiting, unspecified: Secondary | ICD-10-CM | POA: Diagnosis not present

## 2015-04-18 DIAGNOSIS — Z3202 Encounter for pregnancy test, result negative: Secondary | ICD-10-CM | POA: Insufficient documentation

## 2015-04-18 LAB — COMPREHENSIVE METABOLIC PANEL
ALT: 31 U/L (ref 14–54)
ANION GAP: 11 (ref 5–15)
AST: 27 U/L (ref 15–41)
Albumin: 4 g/dL (ref 3.5–5.0)
Alkaline Phosphatase: 75 U/L (ref 38–126)
BUN: 13 mg/dL (ref 6–20)
CHLORIDE: 100 mmol/L — AB (ref 101–111)
CO2: 24 mmol/L (ref 22–32)
CREATININE: 1.03 mg/dL — AB (ref 0.44–1.00)
Calcium: 8.7 mg/dL — ABNORMAL LOW (ref 8.9–10.3)
Glucose, Bld: 108 mg/dL — ABNORMAL HIGH (ref 65–99)
Potassium: 3.8 mmol/L (ref 3.5–5.1)
Sodium: 135 mmol/L (ref 135–145)
Total Bilirubin: 1.1 mg/dL (ref 0.3–1.2)
Total Protein: 7.7 g/dL (ref 6.5–8.1)

## 2015-04-18 LAB — URINE MICROSCOPIC-ADD ON

## 2015-04-18 LAB — URINALYSIS, ROUTINE W REFLEX MICROSCOPIC
GLUCOSE, UA: NEGATIVE mg/dL
Hgb urine dipstick: NEGATIVE
Ketones, ur: NEGATIVE mg/dL
Nitrite: NEGATIVE
PROTEIN: 100 mg/dL — AB
Specific Gravity, Urine: 1.039 — ABNORMAL HIGH (ref 1.005–1.030)
pH: 6 (ref 5.0–8.0)

## 2015-04-18 LAB — CBC
HCT: 44.1 % (ref 36.0–46.0)
Hemoglobin: 14.4 g/dL (ref 12.0–15.0)
MCH: 30.1 pg (ref 26.0–34.0)
MCHC: 32.7 g/dL (ref 30.0–36.0)
MCV: 92.3 fL (ref 78.0–100.0)
PLATELETS: 179 10*3/uL (ref 150–400)
RBC: 4.78 MIL/uL (ref 3.87–5.11)
RDW: 12.7 % (ref 11.5–15.5)
WBC: 7.5 10*3/uL (ref 4.0–10.5)

## 2015-04-18 LAB — I-STAT BETA HCG BLOOD, ED (MC, WL, AP ONLY)

## 2015-04-18 LAB — LIPASE, BLOOD: LIPASE: 21 U/L (ref 11–51)

## 2015-04-18 MED ORDER — ONDANSETRON HCL 4 MG/2ML IJ SOLN
4.0000 mg | Freq: Once | INTRAMUSCULAR | Status: AC
  Administered 2015-04-18: 4 mg via INTRAVENOUS
  Filled 2015-04-18: qty 2

## 2015-04-18 MED ORDER — ONDANSETRON 4 MG PO TBDP: 4.0000 mg | ORAL_TABLET | Freq: Three times a day (TID) | ORAL | Status: DC | PRN

## 2015-04-18 MED ORDER — PROMETHAZINE HCL 25 MG/ML IJ SOLN
25.0000 mg | Freq: Once | INTRAMUSCULAR | Status: AC
  Administered 2015-04-18: 25 mg via INTRAVENOUS
  Filled 2015-04-18: qty 1

## 2015-04-18 MED ORDER — ACETAMINOPHEN 325 MG PO TABS
650.0000 mg | ORAL_TABLET | Freq: Once | ORAL | Status: AC
Start: 2015-04-18 — End: 2015-04-18
  Administered 2015-04-18: 650 mg via ORAL
  Filled 2015-04-18: qty 2

## 2015-04-18 MED ORDER — SODIUM CHLORIDE 0.9 % IV BOLUS (SEPSIS)
1000.0000 mL | Freq: Once | INTRAVENOUS | Status: AC
  Administered 2015-04-18: 1000 mL via INTRAVENOUS

## 2015-04-18 NOTE — ED Notes (Signed)
Per EMS pt c/o emesis onset yesterday.

## 2015-04-18 NOTE — Discharge Instructions (Signed)
Take your medications as prescribed as needed for nausea. I recommend continuing to drink fluids to remain hydrated and also eating a bland diet for the next few days until your symptoms have improved. Follow-up with your primary care provider in 2 days. Return to the emergency department if symptoms worsen or new onset of fever, headache, shortness of breath, chest pain, cough, unable to keep food/fluids down, blood in stool.

## 2015-04-18 NOTE — ED Notes (Signed)
Pt c/o N/V/D since last night. Pt also c/o generalized body aches. Pt also c/o unproductive cough. A&Ox4. Denise fever.

## 2015-04-18 NOTE — ED Provider Notes (Signed)
CSN: [id number]     Arrival date & time 04/18/15  1229 History   First MD Initiated Contact with Patient 04/18/15 1727     Chief Complaint  Patient presents with  . Emesis     (Consider location/radiation/quality/duration/timing/severity/associated sxs/prior Treatment) HPI  Patient is a 50 year old female with past medical history of headache, depression and drug abuse who presents to the ED with complaint of nausea and vomiting, onset last night. Patient reports having 2-3 episodes of NBNB vomiting. She also notes she has had 2 episodes of nonbloody diarrhea since last night. Endorses associated generalized malaise. Denies fever, chills, headache, sore throat, nasal congestion, cough, shortness of breath, chest pain, abdominal pain, vaginal bleeding, vaginal discharge, urinary symptoms. Patient denies taking any medications prior to arrival. Surgical history includes C-section and tubal ligation. Patient notes she has not had a menstrual cycle for more than 5 years (prior to tubal ligation). Patient denies any sick contacts or anyone else at home having similar symptoms. She notes she has not been able to eat or drink anything for the past 24 hours due to nausea and vomiting.  Past Medical History  Diagnosis Date  . Asthma   . Sleep apnea   . Headache(784.0)   . Depression     on meds  . Drug abuse 2002  . HSV (herpes simplex virus) infection   . Ovarian cyst   . Muscle spasms of head or neck    Past Surgical History  Procedure Laterality Date  . Cesarean section    . Tubal ligation     Family History  Problem Relation Age of Onset  . Anesthesia problems Neg Hx   . Hypotension Neg Hx   . Malignant hyperthermia Neg Hx   . Pseudochol deficiency Neg Hx   . Diabetes Mother   . Alcohol abuse Father   . Liver disease Father    Social History  Substance Use Topics  . Smoking status: Never Smoker   . Smokeless tobacco: Never Used  . Alcohol Use: No   OB History    Gravida  Para Term Preterm AB TAB SAB Ectopic Multiple Living   5 5 5  0 0 0 0 0 0 5     Review of Systems  Constitutional:       Generalized malaise  Gastrointestinal: Positive for nausea, vomiting and diarrhea.  All other systems reviewed and are negative.     Allergies  Review of patient's allergies indicates no known allergies.  Home Medications   Prior to Admission medications   Medication Sig Start Date End Date Taking? Authorizing Provider  albuterol (PROVENTIL HFA;VENTOLIN HFA) 108 (90 BASE) MCG/ACT inhaler Inhale 2 puffs into the lungs every 6 (six) hours as needed for wheezing or shortness of breath. 04/06/14  Yes Jennifer Piepenbrink, PA-C  FLUoxetine (PROZAC) 20 MG capsule Take 40 mg by mouth daily.   Yes Historical Provider, MD  metFORMIN (GLUCOPHAGE) 500 MG tablet Take 500 mg by mouth 2 (two) times daily with a meal.   Yes Historical Provider, MD  omeprazole (PRILOSEC) 20 MG capsule Take 1 capsule (20 mg total) by mouth daily. 03/31/15  Yes Shary Decamp, PA-C  topiramate (TOPAMAX) 50 MG tablet Take 50 mg by mouth 2 (two) times daily.   Yes Historical Provider, MD  traZODone (DESYREL) 100 MG tablet Take 100 mg by mouth at bedtime.  07/01/13  Yes Historical Provider, MD  ondansetron (ZOFRAN ODT) 4 MG disintegrating tablet Take 1 tablet (4 mg total) by mouth  every 8 (eight) hours as needed for nausea or vomiting. 04/18/15   Nona Dell, PA-C  promethazine (PHENERGAN) 25 MG tablet Take 1 tablet (25 mg total) by mouth every 6 (six) hours as needed for nausea or vomiting. Patient not taking: Reported on 03/31/2015 02/05/14   Elmyra Ricks Pisciotta, PA-C   BP 160/92 mmHg  Pulse 73  Temp(Src) 98.5 F (36.9 C) (Oral)  Resp 18  SpO2 98%  LMP 07/29/2012 Physical Exam  Constitutional: She is oriented to person, place, and time. She appears well-developed and well-nourished. No distress.  HENT:  Head: Normocephalic and atraumatic.  Mouth/Throat: Oropharynx is clear and moist. No  oropharyngeal exudate.  Eyes: Conjunctivae and EOM are normal. Right eye exhibits no discharge. Left eye exhibits no discharge. No scleral icterus.  Neck: Normal range of motion. Neck supple.  Cardiovascular: Normal rate, regular rhythm, normal heart sounds and intact distal pulses.   Pulmonary/Chest: Effort normal and breath sounds normal. No respiratory distress. She has no wheezes. She has no rales. She exhibits no tenderness.  Abdominal: Soft. Bowel sounds are normal. She exhibits no distension and no mass. There is no tenderness. There is no rebound and no guarding.  Musculoskeletal: Normal range of motion. She exhibits no edema or tenderness.  Lymphadenopathy:    She has no cervical adenopathy.  Neurological: She is alert and oriented to person, place, and time.  Skin: Skin is warm and dry.  Nursing note and vitals reviewed.   ED Course  Procedures (including critical care time) Labs Review Labs Reviewed  COMPREHENSIVE METABOLIC PANEL - Abnormal; Notable for the following:    Chloride 100 (*)    Glucose, Bld 108 (*)    Creatinine, Ser 1.03 (*)    Calcium 8.7 (*)    All other components within normal limits  URINALYSIS, ROUTINE W REFLEX MICROSCOPIC (NOT AT [facility]) - Abnormal; Notable for the following:    Color, Urine AMBER (*)    APPearance CLOUDY (*)    Specific Gravity, Urine 1.039 (*)    Bilirubin Urine SMALL (*)    Protein, ur 100 (*)    Leukocytes, UA TRACE (*)    All other components within normal limits  URINE MICROSCOPIC-ADD ON - Abnormal; Notable for the following:    Squamous Epithelial / LPF 6-30 (*)    Bacteria, UA RARE (*)    All other components within normal limits  LIPASE, BLOOD  CBC  I-STAT BETA HCG BLOOD, ED ([facility], [facility], [facility] ONLY)    Imaging Review No results found. I have personally reviewed and evaluated these images and lab results as part of my medical decision-making.  Filed Vitals:   04/18/15 1731 04/18/15 1933  BP: 135/90 160/92  Pulse: 91 73   Temp: 98.5 F (36.9 C)   Resp: 20 18     MDM   Final diagnoses:  Non-intractable vomiting with nausea, vomiting of unspecified type  Diarrhea, unspecified type    Patient presents with nausea, vomiting and diarrhea. VSS. Exam unremarkable, abdominal exam benign. CL mildly decreased at 100, remaining labs otherwise unremarkable. Pregnancy negative. I suspect pt's sxs are likely due to viral gastroenteritis and do not feel that any further workup or imaging is warranted at this time. Plan to hydrate pt with 1L IVF, give antiemetics and PO challenge pt in the ED.   After first dose of Zofran, patient was reevaluated. At that time she reports her nausea has improved. Will try to by mouth challenge patient in the ED. I  was notified by nurse that patient had an episode of vomiting after trying to drink fluids in the ED. Patient given a second dose of Phenergan. Patient able tolerate by mouth in the ED. She notes she is feeling much better and is no longer feels nauseous. Discussed results and plan for d/c with pt.   Evaluation does not show pathology requring ongoing emergent intervention or admission. Pt is hemodynamically stable and mentating appropriately. Discussed findings/results and plan with patient/guardian, who agrees with plan. All questions answered. Return precautions discussed and outpatient follow up given.    Chesley Noon [place], [place] 04/18/15 2127  Orlie Dakin, MD 04/18/15 [phone]

## 2015-04-18 NOTE — ED Notes (Signed)
ED PA at bedside

## 2015-09-14 ENCOUNTER — Emergency Department (HOSPITAL_COMMUNITY): Payer: Medicare Other

## 2015-09-14 ENCOUNTER — Emergency Department (HOSPITAL_COMMUNITY)
Admission: EM | Admit: 2015-09-14 | Discharge: 2015-09-14 | Disposition: A | Discharge status: Home / Self Care | Payer: Medicare Other | Attending: Emergency Medicine | Admitting: Emergency Medicine

## 2015-09-14 ENCOUNTER — Encounter (HOSPITAL_COMMUNITY): Payer: Self-pay

## 2015-09-14 DIAGNOSIS — J45909 Unspecified asthma, uncomplicated: Secondary | ICD-10-CM | POA: Insufficient documentation

## 2015-09-14 DIAGNOSIS — Y999 Unspecified external cause status: Secondary | ICD-10-CM | POA: Insufficient documentation

## 2015-09-14 DIAGNOSIS — Z79899 Other long term (current) drug therapy: Secondary | ICD-10-CM | POA: Diagnosis not present

## 2015-09-14 DIAGNOSIS — W228XXA Striking against or struck by other objects, initial encounter: Secondary | ICD-10-CM | POA: Insufficient documentation

## 2015-09-14 DIAGNOSIS — S8001XA Contusion of right knee, initial encounter: Secondary | ICD-10-CM | POA: Insufficient documentation

## 2015-09-14 DIAGNOSIS — Y939 Activity, unspecified: Secondary | ICD-10-CM | POA: Diagnosis not present

## 2015-09-14 DIAGNOSIS — Z7984 Long term (current) use of oral hypoglycemic drugs: Secondary | ICD-10-CM | POA: Diagnosis not present

## 2015-09-14 DIAGNOSIS — Y929 Unspecified place or not applicable: Secondary | ICD-10-CM | POA: Diagnosis not present

## 2015-09-14 DIAGNOSIS — S8991XA Unspecified injury of right lower leg, initial encounter: Secondary | ICD-10-CM | POA: Diagnosis present

## 2015-09-14 MED ORDER — HYDROCODONE-ACETAMINOPHEN 5-325 MG PO TABS
2.0000 | ORAL_TABLET | Freq: Once | ORAL | Status: AC
  Administered 2015-09-14: 2 via ORAL
  Filled 2015-09-14: qty 2

## 2015-09-14 MED ORDER — MAGNESIUM CITRATE PO SOLN
1.0000 | Freq: Once | ORAL | Status: AC
  Administered 2015-09-14: 1 via ORAL
  Filled 2015-09-14: qty 296

## 2015-09-14 MED ORDER — HYDROCODONE-ACETAMINOPHEN 5-325 MG PO TABS: 1.0000 | ORAL_TABLET | ORAL | Status: DC | PRN

## 2015-09-14 MED ORDER — DOCUSATE SODIUM 100 MG PO CAPS: 100.0000 mg | ORAL_CAPSULE | Freq: Two times a day (BID) | ORAL | Status: DC

## 2015-09-14 MED ORDER — POLYETHYLENE GLYCOL 3350 17 GM/SCOOP PO POWD: 1.0000 | Freq: Once | ORAL | Status: DC

## 2015-09-14 NOTE — ED Notes (Signed)
Patient is leaving today on the bus. She was given crutches and a leg immobilizer. D/C instructions reviewed with PA and patient had no further questions

## 2015-09-14 NOTE — Discharge Instructions (Signed)
Medications: Norco, Colace, Miralax  Treatment: Take 1-2 Norco every 4 hours as needed for severe pain. You can take ibuprofen every 4-6 hours for more mild pain. Take Colace every 12 hours on days that you're taking the Norco. Take MiraLAX on days you do not have a bowel movement. Wear your knee immobilizer at all times when you're walking to help with your pain. Use your crutches when walking taking care not to fall. Ice at least 3-4 times daily alternating 20 minutes on, 20 minutes off.  Follow-up: Please follow-up with your primary care provider in 2-3 days for follow-up of today's visit and recheck of your symptoms. Please return to the emergency department if you develop any new or worsening symptoms.   Contusion A contusion is a deep bruise. Contusions are the result of a blunt injury to tissues and muscle fibers under the skin. The injury causes bleeding under the skin. The skin overlying the contusion may turn blue, purple, or yellow. Minor injuries will give you a painless contusion, but more severe contusions may stay painful and swollen for a few weeks.  CAUSES  This condition is usually caused by a blow, trauma, or direct force to an area of the body. SYMPTOMS  Symptoms of this condition include:  Swelling of the injured area.  Pain and tenderness in the injured area.  Discoloration. The area may have redness and then turn blue, purple, or yellow. DIAGNOSIS  This condition is diagnosed based on a physical exam and medical history. An X-ray, CT scan, or MRI may be needed to determine if there are any associated injuries, such as broken bones (fractures). TREATMENT  Specific treatment for this condition depends on what area of the body was injured. In general, the best treatment for a contusion is resting, icing, applying pressure to (compression), and elevating the injured area. This is often called the RICE strategy. Over-the-counter anti-inflammatory medicines may also be  recommended for pain control.  HOME CARE INSTRUCTIONS   Rest the injured area.  If directed, apply ice to the injured area:  Put ice in a plastic bag.  Place a towel between your skin and the bag.  Leave the ice on for 20 minutes, 2-3 times per day.  If directed, apply light compression to the injured area using an elastic bandage. Make sure the bandage is not wrapped too tightly. Remove and reapply the bandage as directed by your health care provider.  If possible, raise (elevate) the injured area above the level of your heart while you are sitting or lying down.  Take over-the-counter and prescription medicines only as told by your health care provider. SEEK MEDICAL CARE IF:  Your symptoms do not improve after several days of treatment.  Your symptoms get worse.  You have difficulty moving the injured area. SEEK IMMEDIATE MEDICAL CARE IF:   You have severe pain.  You have numbness in a hand or foot.  Your hand or foot turns pale or cold.   This information is not intended to replace advice given to you by your health care provider. Make sure you discuss any questions you have with your health care provider.   Document Released: 12/20/2004 Document Revised: 12/01/2014 Document Reviewed: 07/28/2014 Elsevier Interactive Patient Education [employer].

## 2015-09-14 NOTE — ED Provider Notes (Signed)
CSN: [id number]     Arrival date & time 09/14/15  [id number] History   First MD Initiated Contact with Patient 09/14/15 1045     Chief Complaint  Patient presents with  . Knee Pain     (Consider location/radiation/quality/duration/timing/severity/associated sxs/prior Treatment) HPI Comments: Patient is a 50 year old female with history of asthma who presents with right knee pain. Patient states she was taking apart an entertainment system last evening when she wound up and hit her right anterior knee with a hammer. Patient has had pain ever since. Patient states she has pain radiating throughout her whole right leg. Patient is able to weight-bear with pain. Patient has pain with and without movement area patient has tried ice at home without much relief. Patient also states that she has not had a bowel movement in one week. Patient normally has bowel movements 3 times per week. Patient has not tried any medication at home for this. Patient denies any chest pain, shortness of breath, abdominal pain, nausea, vomiting, dysuria.  Patient is a 50 y.o. female presenting with knee pain. The history is provided by the patient.  Knee Pain Associated symptoms: no back pain and no fever     Past Medical History  Diagnosis Date  . Asthma   . Sleep apnea   . Headache(784.0)   . Depression     on meds  . Drug abuse 2002  . HSV (herpes simplex virus) infection   . Ovarian cyst   . Muscle spasms of head or neck    Past Surgical History  Procedure Laterality Date  . Cesarean section    . Tubal ligation     Family History  Problem Relation Age of Onset  . Anesthesia problems Neg Hx   . Hypotension Neg Hx   . Malignant hyperthermia Neg Hx   . Pseudochol deficiency Neg Hx   . Diabetes Mother   . Alcohol abuse Father   . Liver disease Father    Social History  Substance Use Topics  . Smoking status: Never Smoker   . Smokeless tobacco: Never Used  . Alcohol Use: No   OB History    Gravida Para  Term Preterm AB TAB SAB Ectopic Multiple Living   5 5 5  0 0 0 0 0 0 5     Review of Systems  Constitutional: Negative for fever and chills.  HENT: Negative for facial swelling and sore throat.   Respiratory: Negative for shortness of breath.   Cardiovascular: Negative for chest pain.  Gastrointestinal: Positive for constipation. Negative for nausea, vomiting and abdominal pain.  Genitourinary: Negative for dysuria.  Musculoskeletal: Positive for joint swelling and arthralgias. Negative for back pain.  Skin: Negative for rash and wound.  Neurological: Negative for headaches.  Psychiatric/Behavioral: The patient is not nervous/anxious.       Allergies  Review of patient's allergies indicates no known allergies.  Home Medications   Prior to Admission medications   Medication Sig Start Date End Date Taking? Authorizing Provider  albuterol (PROVENTIL HFA;VENTOLIN HFA) 108 (90 BASE) MCG/ACT inhaler Inhale 2 puffs into the lungs every 6 (six) hours as needed for wheezing or shortness of breath. 04/06/14   Jennifer Piepenbrink, PA-C  docusate sodium (COLACE) 100 MG capsule Take 1 capsule (100 mg total) by mouth every 12 (twelve) hours. 09/14/15   Frederica Kuster, PA-C  FLUoxetine (PROZAC) 20 MG capsule Take 40 mg by mouth daily.    Historical Provider, MD  HYDROcodone-acetaminophen (NORCO/VICODIN) 5-325 MG tablet Take  1-2 tablets by mouth every 4 (four) hours as needed. 09/14/15   Frederica Kuster, PA-C  metFORMIN (GLUCOPHAGE) 500 MG tablet Take 500 mg by mouth 2 (two) times daily with a meal.    Historical Provider, MD  omeprazole (PRILOSEC) 20 MG capsule Take 1 capsule (20 mg total) by mouth daily. 03/31/15   Shary Decamp, PA-C  ondansetron (ZOFRAN ODT) 4 MG disintegrating tablet Take 1 tablet (4 mg total) by mouth every 8 (eight) hours as needed for nausea or vomiting. 04/18/15   Nona Dell, PA-C  polyethylene glycol powder (GLYCOLAX/MIRALAX) powder Take 255 g by mouth once. 09/14/15    Frederica Kuster, PA-C  promethazine (PHENERGAN) 25 MG tablet Take 1 tablet (25 mg total) by mouth every 6 (six) hours as needed for nausea or vomiting. Patient not taking: Reported on 03/31/2015 02/05/14   Elmyra Ricks Pisciotta, PA-C  topiramate (TOPAMAX) 50 MG tablet Take 50 mg by mouth 2 (two) times daily.    Historical Provider, MD  traZODone (DESYREL) 100 MG tablet Take 100 mg by mouth at bedtime.  07/01/13   Historical Provider, MD   BP 143/119 mmHg  Pulse 78  Temp(Src) 98.6 F (37 C) (Oral)  Ht 5\' 2"  (1.575 m)  Wt 120.203 kg  BMI 48.46 kg/m2  SpO2 100%  LMP 07/29/2012 Physical Exam  Constitutional: She appears well-developed and well-nourished. No distress.  HENT:  Head: Normocephalic and atraumatic.  Mouth/Throat: Oropharynx is clear and moist. No oropharyngeal exudate.  Eyes: Conjunctivae are normal. Pupils are equal, round, and reactive to light. Right eye exhibits no discharge. Left eye exhibits no discharge. No scleral icterus.  Neck: Normal range of motion. Neck supple. No thyromegaly present.  Cardiovascular: Normal rate, regular rhythm, normal heart sounds and intact distal pulses.  Exam reveals no gallop and no friction rub.   No murmur heard. Pulmonary/Chest: Effort normal and breath sounds normal. No stridor. No respiratory distress. She has no wheezes. She has no rales.  Abdominal: Soft. Bowel sounds are normal. She exhibits no distension. There is no tenderness. There is no rebound and no guarding.  Musculoskeletal: She exhibits no edema.       Right knee: She exhibits decreased range of motion, swelling and ecchymosis. She exhibits no LCL laxity, normal meniscus and no MCL laxity. Tenderness found. Patellar tendon tenderness noted. No medial joint line and no lateral joint line tenderness noted.       Legs: Tenderness to palpation over anterior knee, one mobile 3 mm cyst like nodule in the anterior knee; not hard; pain with range of motion, however full range of motion  present; ecchymosis over right anterior knee inferior to patella; negative anterior/posterior drawer; pain with there are some valgus stress in the anterior knee; distal pulses intact  Lymphadenopathy:    She has no cervical adenopathy.  Neurological: She is alert. Coordination normal.  Normal sensation to lower extremities  Skin: Skin is warm and dry. No rash noted. She is not diaphoretic. No pallor.  Psychiatric: She has a normal mood and affect.  Nursing note and vitals reviewed.   ED Course  Procedures (including critical care time) Labs Review Labs Reviewed - No data to display  Imaging Review Dg Knee Complete 4 Views Right  09/14/2015  CLINICAL DATA:  RIGHT knee pain since yesterday, accidentally struck knee with a hammer, knot, initial encounter EXAM: RIGHT KNEE - COMPLETE 4+ VIEW COMPARISON:  None FINDINGS: Osseous mineralization normal. Medial compartment joint space narrowing. Tiny patellar spurs at  patellofemoral joint. No acute fracture, dislocation or bone destruction. Mild anterior soft tissue swelling. IMPRESSION: Mild degenerative changes RIGHT knee. No acute bony abnormalities. Electronically Signed   By: Lavonia Dana M.D.   On: 09/14/2015 10:43   I have personally reviewed and evaluated these images and lab results as part of my medical decision-making.   EKG Interpretation None      MDM   Patient with right knee contusion. X-ray of right knee shows mild degenerative changes, but no acute bony abnormalities. Dr. Jeneen Rinks suspects a prior cyst as an etiology of the mobile nodule as no bony fragments or cartilage seen on x-ray. We will treat pain with Norco. Patient placed in knee immobilizer for comfort, and crutches. Patient advised to wear the knee immobilizer as long as she is moving her knee and walking. Patient's constipation treated with magnesium citrate in ED and discharged with MiraLAX. Patient also discharged with Colace which she should take when she was taking the  [place]. Patient advised to ice regularly at home. Patient with follow-up to PCP for recheck of symptoms. Patient also evaluated by Dr. Jeneen Rinks who is in agreement with plan. Patient vitals stable throughout ED course and discharged in satisfactory condition.  Final diagnoses:  Knee contusion, right, initial encounter        Frederica Kuster, PA-C 09/14/15 1129  Tanna Furry, MD 09/21/15 [phone]

## 2015-09-14 NOTE — ED Notes (Signed)
Pt. Complains of knee pain that started yesterday after she accidentally hit it with a hammer. She says she can feel "little knots in her knee" when she rubs it.

## 2015-10-04 ENCOUNTER — Emergency Department (HOSPITAL_COMMUNITY)
Admission: EM | Admit: 2015-10-04 | Discharge: 2015-10-05 | Disposition: A | Discharge status: Home / Self Care | Payer: Medicare Other | Attending: Emergency Medicine | Admitting: Emergency Medicine

## 2015-10-04 ENCOUNTER — Emergency Department (HOSPITAL_COMMUNITY): Payer: Medicare Other

## 2015-10-04 ENCOUNTER — Encounter (HOSPITAL_COMMUNITY): Payer: Self-pay

## 2015-10-04 DIAGNOSIS — F10129 Alcohol abuse with intoxication, unspecified: Secondary | ICD-10-CM | POA: Insufficient documentation

## 2015-10-04 DIAGNOSIS — F1010 Alcohol abuse, uncomplicated: Secondary | ICD-10-CM

## 2015-10-04 DIAGNOSIS — J45909 Unspecified asthma, uncomplicated: Secondary | ICD-10-CM | POA: Diagnosis not present

## 2015-10-04 DIAGNOSIS — F10920 Alcohol use, unspecified with intoxication, uncomplicated: Secondary | ICD-10-CM

## 2015-10-04 DIAGNOSIS — F329 Major depressive disorder, single episode, unspecified: Secondary | ICD-10-CM | POA: Diagnosis not present

## 2015-10-04 DIAGNOSIS — Z7984 Long term (current) use of oral hypoglycemic drugs: Secondary | ICD-10-CM | POA: Insufficient documentation

## 2015-10-04 DIAGNOSIS — Z7951 Long term (current) use of inhaled steroids: Secondary | ICD-10-CM | POA: Insufficient documentation

## 2015-10-04 DIAGNOSIS — I10 Essential (primary) hypertension: Secondary | ICD-10-CM | POA: Insufficient documentation

## 2015-10-04 DIAGNOSIS — Z79899 Other long term (current) drug therapy: Secondary | ICD-10-CM | POA: Diagnosis not present

## 2015-10-04 DIAGNOSIS — F1410 Cocaine abuse, uncomplicated: Secondary | ICD-10-CM | POA: Insufficient documentation

## 2015-10-04 DIAGNOSIS — R4182 Altered mental status, unspecified: Secondary | ICD-10-CM | POA: Insufficient documentation

## 2015-10-04 LAB — COMPREHENSIVE METABOLIC PANEL
ALT: 16 U/L (ref 14–54)
AST: 17 U/L (ref 15–41)
Albumin: 4.2 g/dL (ref 3.5–5.0)
Alkaline Phosphatase: 74 U/L (ref 38–126)
Anion gap: 10 (ref 5–15)
BUN: 16 mg/dL (ref 6–20)
CALCIUM: 9.7 mg/dL (ref 8.9–10.3)
CO2: 25 mmol/L (ref 22–32)
CREATININE: 0.9 mg/dL (ref 0.44–1.00)
Chloride: 105 mmol/L (ref 101–111)
Glucose, Bld: 86 mg/dL (ref 65–99)
Potassium: 3.8 mmol/L (ref 3.5–5.1)
SODIUM: 140 mmol/L (ref 135–145)
Total Bilirubin: 1.2 mg/dL (ref 0.3–1.2)
Total Protein: 7.5 g/dL (ref 6.5–8.1)

## 2015-10-04 LAB — RAPID URINE DRUG SCREEN, HOSP PERFORMED
Amphetamines: NOT DETECTED
BENZODIAZEPINES: NOT DETECTED
Barbiturates: NOT DETECTED
Cocaine: POSITIVE — AB
Opiates: NOT DETECTED
Tetrahydrocannabinol: NOT DETECTED

## 2015-10-04 LAB — I-STAT CG4 LACTIC ACID, ED: Lactic Acid, Venous: 2.1 mmol/L (ref 0.5–1.9)

## 2015-10-04 LAB — CBC
HCT: 39.2 % (ref 36.0–46.0)
Hemoglobin: 12.9 g/dL (ref 12.0–15.0)
MCH: 30.4 pg (ref 26.0–34.0)
MCHC: 32.9 g/dL (ref 30.0–36.0)
MCV: 92.2 fL (ref 78.0–100.0)
PLATELETS: 278 10*3/uL (ref 150–400)
RBC: 4.25 MIL/uL (ref 3.87–5.11)
RDW: 11.9 % (ref 11.5–15.5)
WBC: 8.4 10*3/uL (ref 4.0–10.5)

## 2015-10-04 LAB — CBG MONITORING, ED: GLUCOSE-CAPILLARY: 89 mg/dL (ref 65–99)

## 2015-10-04 LAB — ETHANOL: ALCOHOL ETHYL (B): 252 mg/dL — AB (ref <5)

## 2015-10-04 MED ORDER — SODIUM CHLORIDE 0.9 % IV BOLUS (SEPSIS)
1000.0000 mL | Freq: Once | INTRAVENOUS | Status: AC
  Administered 2015-10-04: 1000 mL via INTRAVENOUS

## 2015-10-04 MED ORDER — ONDANSETRON HCL 4 MG/2ML IJ SOLN
4.0000 mg | Freq: Once | INTRAMUSCULAR | Status: AC | PRN
  Administered 2015-10-04: 4 mg via INTRAVENOUS
  Filled 2015-10-04: qty 2

## 2015-10-04 NOTE — ED Provider Notes (Signed)
CSN: [id number]     Arrival date & time 10/04/15  2057 History   First MD Initiated Contact with Patient 10/04/15 2154     Chief Complaint  Patient presents with  . Addiction Problem  . Polysubstance Abuse   . Hypertension    Level V caveat due to altered mental status.  Patient is a 50 y.o. female presenting with hypertension. The history is provided by the patient and the EMS personnel.  Hypertension  Patient states she is here for treatment of her alcoholism and cocaine abuse. States she last used today. Patient does quickly fall back to sleep as somewhat confused. Patient reportedly has been vomiting.   Past Medical History  Diagnosis Date  . Asthma   . Sleep apnea   . Headache(784.0)   . Depression     on meds  . Drug abuse 2002  . HSV (herpes simplex virus) infection   . Ovarian cyst   . Muscle spasms of head or neck    Past Surgical History  Procedure Laterality Date  . Cesarean section    . Tubal ligation     Family History  Problem Relation Age of Onset  . Anesthesia problems Neg Hx   . Hypotension Neg Hx   . Malignant hyperthermia Neg Hx   . Pseudochol deficiency Neg Hx   . Diabetes Mother   . Alcohol abuse Father   . Liver disease Father    Social History  Substance Use Topics  . Smoking status: Never Smoker   . Smokeless tobacco: Never Used  . Alcohol Use: No   OB History    Gravida Para Term Preterm AB TAB SAB Ectopic Multiple Living   5 5 5  0 0 0 0 0 0 5     Review of Systems  Unable to perform ROS: Mental status change  Constitutional: Negative for appetite change.      Allergies  Review of patient's allergies indicates no known allergies.  Home Medications   Prior to Admission medications   Medication Sig Start Date End Date Taking? Authorizing Provider  albuterol (PROVENTIL HFA;VENTOLIN HFA) 108 (90 BASE) MCG/ACT inhaler Inhale 2 puffs into the lungs every 6 (six) hours as needed for wheezing or shortness of breath. 04/06/14  Yes  Jennifer Piepenbrink, PA-C  FLUoxetine (PROZAC) 20 MG capsule Take 40 mg by mouth daily.   Yes Historical Provider, MD  metFORMIN (GLUCOPHAGE) 500 MG tablet Take 500 mg by mouth 2 (two) times daily with a meal.   Yes Historical Provider, MD  topiramate (TOPAMAX) 50 MG tablet Take 50 mg by mouth 2 (two) times daily. Reported on 09/14/2015   Yes Historical Provider, MD  traZODone (DESYREL) 100 MG tablet Take 100 mg by mouth at bedtime.  07/01/13  Yes Historical Provider, MD  docusate sodium (COLACE) 100 MG capsule Take 1 capsule (100 mg total) by mouth every 12 (twelve) hours. Patient not taking: Reported on 10/04/2015 09/14/15   Frederica Kuster, PA-C  HYDROcodone-acetaminophen (NORCO/VICODIN) 5-325 MG tablet Take 1-2 tablets by mouth every 4 (four) hours as needed. Patient not taking: Reported on 10/04/2015 09/14/15   Frederica Kuster, PA-C  omeprazole (PRILOSEC) 20 MG capsule Take 1 capsule (20 mg total) by mouth daily. Patient not taking: Reported on 09/14/2015 03/31/15   Shary Decamp, PA-C  ondansetron (ZOFRAN ODT) 4 MG disintegrating tablet Take 1 tablet (4 mg total) by mouth every 8 (eight) hours as needed for nausea or vomiting. Patient not taking: Reported on 09/14/2015 04/18/15  Chesley Noon Nadeau, PA-C  polyethylene glycol powder (GLYCOLAX/MIRALAX) powder Take 255 g by mouth once. Patient not taking: Reported on 10/04/2015 09/14/15   Frederica Kuster, PA-C  promethazine (PHENERGAN) 25 MG tablet Take 1 tablet (25 mg total) by mouth every 6 (six) hours as needed for nausea or vomiting. Patient not taking: Reported on 03/31/2015 02/05/14   Elmyra Ricks Pisciotta, PA-C   BP 118/58 mmHg  Pulse 68  Temp(Src) 98.7 F (37.1 C) (Oral)  Resp 15  SpO2 99%  LMP 07/29/2012 Physical Exam  Constitutional: She appears well-developed.  HENT:  Head: Atraumatic.  Eyes: EOM are normal.  Neck: Neck supple.  Cardiovascular: Normal rate.   Pulmonary/Chest: Effort normal.  Abdominal: Soft. There is no tenderness.   Neurological: She is alert.  Patient will answer questions but has slurred speech. Does go back to sleep.  Skin: Skin is warm.    ED Course  Procedures (including critical care time) Labs Review Labs Reviewed  ETHANOL - Abnormal; Notable for the following:    Alcohol, Ethyl (B) 252 (*)    All other components within normal limits  URINE RAPID DRUG SCREEN, HOSP PERFORMED - Abnormal; Notable for the following:    Cocaine POSITIVE (*)    All other components within normal limits  I-STAT CG4 LACTIC ACID, ED - Abnormal; Notable for the following:    Lactic Acid, Venous 2.10 (*)    All other components within normal limits  COMPREHENSIVE METABOLIC PANEL  CBC  TROPONIN I  CBG MONITORING, ED    Imaging Review Dg Chest Portable 1 View  10/04/2015  CLINICAL DATA:  Unresponsive, history of drug abuse. EXAM: PORTABLE CHEST 1 VIEW COMPARISON:  Chest radiograph April 06, 2014 FINDINGS: Cardiac silhouette appears mildly enlarged, mediastinal silhouette is nonsuspicious. Pulmonary vascular congestion and mild interstitial prominence without pleural effusion or focal consolidation though LEFT costophrenic angle is not imaged. No pneumothorax. Soft tissue planes and included osseous structures are nonsuspicious. IMPRESSION: Mild cardiomegaly and mild interstitial edema. Electronically Signed   By: Elon Alas M.D.   On: 10/04/2015 22:25   I have personally reviewed and evaluated these images and lab results as part of my medical decision-making.   EKG Interpretation   Date/Time:  Tuesday October 04 2015 22:02:41 EDT Ventricular Rate:  71 PR Interval:    QRS Duration: 89 QT Interval:  397 QTC Calculation: 432 R Axis:   87 Text Interpretation:  Sinus rhythm I Confirmed by Alvino Chapel  MD, Ovid Curd  [id number]) on 10/04/2015 11:57:41 PM      MDM   Final diagnoses:  Cocaine abuse  Alcohol abuse    Patient brought in for cocaine abuse and alcohol abuse. Somewhat altered here. Had complaint  of chest pain but EKG and lab work reassuring. Does have decreased mental status and some apparent confusion. Appears to medically cleared the PATIENT seen by TTS.    Davonna Belling, MD 10/05/15 [phone]

## 2015-10-04 NOTE — ED Notes (Addendum)
BIB EMS from bus stop requesting detox resources from [facility]. Pt is alert, dry heaving, agitated, and restless.

## 2015-10-04 NOTE — ED Notes (Signed)
Bed: WA21 Expected date:  Expected time:  Means of arrival:  Comments: 50 yo F  Detox ETOH and crack use

## 2015-10-04 NOTE — ED Notes (Signed)
EDP made aware of patient lactic result. 

## 2015-10-05 DIAGNOSIS — F1410 Cocaine abuse, uncomplicated: Secondary | ICD-10-CM | POA: Diagnosis not present

## 2015-10-05 LAB — TROPONIN I: Troponin I: <0.03 ng/mL (ref <0.03)

## 2015-10-05 MED ORDER — FLUOXETINE HCL 20 MG PO CAPS
40.0000 mg | ORAL_CAPSULE | Freq: Every day | ORAL | Status: DC
  Administered 2015-10-05: 40 mg via ORAL
  Filled 2015-10-05: qty 2

## 2015-10-05 MED ORDER — VITAMIN B-1 100 MG PO TABS
100.0000 mg | ORAL_TABLET | Freq: Every day | ORAL | Status: DC
  Administered 2015-10-05: 100 mg via ORAL
  Filled 2015-10-05: qty 1

## 2015-10-05 MED ORDER — METFORMIN HCL 500 MG PO TABS
500.0000 mg | ORAL_TABLET | Freq: Two times a day (BID) | ORAL | Status: DC
  Administered 2015-10-05: 500 mg via ORAL
  Filled 2015-10-05: qty 1

## 2015-10-05 MED ORDER — THIAMINE HCL 100 MG/ML IJ SOLN: 100.0000 mg | Freq: Every day | INTRAMUSCULAR | Status: DC

## 2015-10-05 MED ORDER — LORAZEPAM 1 MG PO TABS: 0.0000 mg | ORAL_TABLET | Freq: Two times a day (BID) | ORAL | Status: DC

## 2015-10-05 MED ORDER — ALBUTEROL SULFATE HFA 108 (90 BASE) MCG/ACT IN AERS: 2.0000 | INHALATION_SPRAY | Freq: Four times a day (QID) | RESPIRATORY_TRACT | Status: DC | PRN

## 2015-10-05 MED ORDER — CHLORDIAZEPOXIDE HCL 25 MG PO CAPS: ORAL_CAPSULE | ORAL | Status: DC

## 2015-10-05 MED ORDER — LORAZEPAM 1 MG PO TABS
0.0000 mg | ORAL_TABLET | Freq: Four times a day (QID) | ORAL | Status: DC
  Administered 2015-10-05: 1 mg via ORAL
  Filled 2015-10-05: qty 1

## 2015-10-05 MED ORDER — TOPIRAMATE 25 MG PO TABS
50.0000 mg | ORAL_TABLET | Freq: Two times a day (BID) | ORAL | Status: DC
  Administered 2015-10-05 (×2): 50 mg via ORAL
  Filled 2015-10-05 (×2): qty 2

## 2015-10-05 MED ORDER — PANTOPRAZOLE SODIUM 40 MG PO TBEC
40.0000 mg | DELAYED_RELEASE_TABLET | Freq: Every day | ORAL | Status: DC
  Administered 2015-10-05: 40 mg via ORAL
  Filled 2015-10-05: qty 1

## 2015-10-05 MED ORDER — TRAZODONE HCL 100 MG PO TABS
100.0000 mg | ORAL_TABLET | Freq: Every day | ORAL | Status: DC
  Administered 2015-10-05: 100 mg via ORAL
  Filled 2015-10-05: qty 1

## 2015-10-05 NOTE — ED Provider Notes (Signed)
10:33 AM Patient is calm and cooperative at this time.  No HI or SI.  Observed in the emergency department.  Outpatient resources given for cocaine and alcohol abuse.  Discharged home on a Librium taper.  Resource guide provided.  Primary care follow-up.  She understands to return to the ER for new or worsening symptoms  Jola Schmidt, MD 10/05/15 1034

## 2015-10-05 NOTE — ED Notes (Signed)
Bed: [id number] Expected date:  Expected time:  Means of arrival:  Comments: Room 21

## 2015-10-05 NOTE — Discharge Instructions (Signed)
Community Resource Guide Outpatient Counseling/Substance Abuse Adolescent The [employer] 211 is a great source of information about community services available.  Access by dialing 2-1-1 from anywhere in [place], or by website -  CustodianSupply.fi.   Other Local Resources (Updated 03/2015)  [facility]  Crisis Hotline, available 24 hours a day, 7 days a week: [place], [place]   [facility]  Crisis Hotline, available 24 hours a day, 7 days a week: [place], [place]  [facility]  Suicide Prevention Hotline, available 24 hours a day, 7 days a week: [place], [place], available 24 hours a day, 7 days a week: [place], [place] Access to [employer], available 24 hours a day, 7 days a week: [phone] All   [facility]  Crisis Hotline, available 24 hours a day, 7 days a week: [phone] All   Other Local Resources (Updated 03/2015)  Outpatient Counseling/ Substance Abuse Programs  Services     Address and Phone Number  [facility]  Offers individual counseling [phone] [address], [place], [place] [address]  [place] Medicare, private pay, and private insurance [phone] [address] [place], [place] [address]  [facility]  Provides individual counseling, substance abuse intensive outpatient program (several hours a day, several days a week), day treatment program, and school-based therapy  [place], Medicaid, private insurance [phone] [address], [place], [place] [address]  [facility]  Offers individual counseling, family counseling, group  therapy, substance abuse intensive outpatient program (several hours a day, several days a week), and a partial hospitalization program [phone] [address] [place], [place] [address]  [phone] [address], [place] [address]  [phone] [place], [place] [address]  [phone] [place] [address] [place], [place] [address]  [facility]  Offers individual and family counseling  Accepts Medicaid and private insurance  Offers a sliding scale for uninsured [phone] [address] [place], [place] [address]  [place] private insurance [phone] [address], [place], [place] [address]  [facility] individual counseling and intensive in-home services [phone] [address], [place], [place] [address]  [facility] individual counseling, family counseling, group therapy, domestic violence counseling  Accepts Medicaid and private insurance  Offers sliding scale for uninsured [phone] [address] [place], [place] [address]  [phone] [facility], [address] [place], [place]  [facility]  Offers individual counseling, family counseling group therapy, and school-based therapy  3 locations - [place], [place], and [place]  [place] [address] [place], [place] [address]  [address] [place], [place] [address]  [place], [place] [address]  [facility]  Offers individual and family counseling  Accepts [place] and private insurance  Offers sliding scale for uninsured [phone] [address] [place], [place] [address]  Launa Flight, MD  Accepts private insurance [phone] [address] [place],  [address]  [facility]   Offers outpatient substance abuse counseling, intensive outpatient substance  abuse programs (several hours a day, several days a week), and residential  substance abuse treatment  [phone], or [phone] [address], [phone] [place], [place] private insurance [phone] [place], [place] [address]  [place] Medicare and private insurance [phone] [address] [place], [place] [address]  [facility]    Offers intensive outpatient program (several hours a day, several days a week)  Accepts private pay and private insurance [phone] [place], [place]    Offers intensive outpatient program (several hours a day, several days a week), and partial hospitalization program [phone] [place], [place] [address]  [facility] counseling  Offers individual and family counseling  Accepts private insurance  Offers sliding scale for uninsured [phone] [place], [place] [address]  [facility]  Christian counseling [phone] [address] [place], [place] [address]  [facility]  Offers individual and family counseling  Offers LGBTQ services  Accepts private insurance and private pay [phone] [address] [place], [place] [address]  [facility] individual and family counseling  Accepts private insurance [phone] [address] [place], [place] [address]  [facility]   Adolescent Substance Abuse Program (ASAP): [phone]  [facility] [employer] Structured Day Program: [phone] [place], [place] children ages 38 - 37 and their families  Offers intensive in-home treatment and residential programs [phone] [address] [place],  [address]

## 2015-10-05 NOTE — ED Notes (Signed)
Pt's O2 sat dropped to 76% on room air; pt snoring; O2 @ 2lpm via [place] placed

## 2016-07-08 ENCOUNTER — Encounter (HOSPITAL_COMMUNITY): Payer: Self-pay | Admitting: Emergency Medicine

## 2016-07-08 ENCOUNTER — Emergency Department (HOSPITAL_COMMUNITY)
Admission: EM | Admit: 2016-07-08 | Discharge: 2016-07-08 | Disposition: A | Discharge status: Home / Self Care | Payer: Medicare Other | Attending: Emergency Medicine | Admitting: Emergency Medicine

## 2016-07-08 DIAGNOSIS — Z7984 Long term (current) use of oral hypoglycemic drugs: Secondary | ICD-10-CM | POA: Insufficient documentation

## 2016-07-08 DIAGNOSIS — M25561 Pain in right knee: Secondary | ICD-10-CM | POA: Diagnosis not present

## 2016-07-08 DIAGNOSIS — M25511 Pain in right shoulder: Secondary | ICD-10-CM | POA: Diagnosis not present

## 2016-07-08 DIAGNOSIS — M25512 Pain in left shoulder: Secondary | ICD-10-CM | POA: Insufficient documentation

## 2016-07-08 DIAGNOSIS — J45909 Unspecified asthma, uncomplicated: Secondary | ICD-10-CM | POA: Insufficient documentation

## 2016-07-08 DIAGNOSIS — G8929 Other chronic pain: Secondary | ICD-10-CM

## 2016-07-08 DIAGNOSIS — M549 Dorsalgia, unspecified: Secondary | ICD-10-CM | POA: Diagnosis not present

## 2016-07-08 DIAGNOSIS — Z79899 Other long term (current) drug therapy: Secondary | ICD-10-CM | POA: Insufficient documentation

## 2016-07-08 LAB — URINALYSIS, ROUTINE W REFLEX MICROSCOPIC
Bilirubin Urine: NEGATIVE
Glucose, UA: NEGATIVE mg/dL
Hgb urine dipstick: NEGATIVE
KETONES UR: NEGATIVE mg/dL
LEUKOCYTES UA: NEGATIVE
NITRITE: NEGATIVE
PH: 6 (ref 5.0–8.0)
PROTEIN: NEGATIVE mg/dL
Specific Gravity, Urine: 1.017 (ref 1.005–1.030)

## 2016-07-08 LAB — BASIC METABOLIC PANEL
ANION GAP: 6 (ref 5–15)
BUN: 14 mg/dL (ref 6–20)
CHLORIDE: 102 mmol/L (ref 101–111)
CO2: 25 mmol/L (ref 22–32)
Calcium: 8.5 mg/dL — ABNORMAL LOW (ref 8.9–10.3)
Creatinine, Ser: 0.92 mg/dL (ref 0.44–1.00)
GFR calc Af Amer: >60 mL/min (ref >60)
Glucose, Bld: 101 mg/dL — ABNORMAL HIGH (ref 65–99)
Potassium: 4 mmol/L (ref 3.5–5.1)
Sodium: 133 mmol/L — ABNORMAL LOW (ref 135–145)

## 2016-07-08 LAB — CBC WITH DIFFERENTIAL/PLATELET
BASOS ABS: 0 10*3/uL (ref 0.0–0.1)
Basophils Relative: 0 %
EOS PCT: 2 %
Eosinophils Absolute: 0.1 10*3/uL (ref 0.0–0.7)
HCT: 35.4 % — ABNORMAL LOW (ref 36.0–46.0)
Hemoglobin: 11.4 g/dL — ABNORMAL LOW (ref 12.0–15.0)
Lymphocytes Relative: 28 %
Lymphs Abs: 1.8 10*3/uL (ref 0.7–4.0)
MCH: 28.4 pg (ref 26.0–34.0)
MCHC: 32.2 g/dL (ref 30.0–36.0)
MCV: 88.3 fL (ref 78.0–100.0)
MONO ABS: 0.3 10*3/uL (ref 0.1–1.0)
Monocytes Relative: 4 %
NEUTROS ABS: 4.3 10*3/uL (ref 1.7–7.7)
NEUTROS PCT: 66 %
PLATELETS: 203 10*3/uL (ref 150–400)
RBC: 4.01 MIL/uL (ref 3.87–5.11)
RDW: 12.6 % (ref 11.5–15.5)
WBC: 6.5 10*3/uL (ref 4.0–10.5)

## 2016-07-08 MED ORDER — METHOCARBAMOL 500 MG PO TABS
1000.0000 mg | ORAL_TABLET | Freq: Once | ORAL | Status: AC
  Administered 2016-07-08: 1000 mg via ORAL
  Filled 2016-07-08: qty 2

## 2016-07-08 MED ORDER — NAPROXEN 375 MG PO TABS: 375.0000 mg | ORAL_TABLET | Freq: Two times a day (BID) | ORAL | 0 refills | Status: DC

## 2016-07-08 MED ORDER — METHOCARBAMOL 1000 MG/10ML IJ SOLN
1000.0000 mg | Freq: Once | INTRAMUSCULAR | Status: DC
  Filled 2016-07-08: qty 10

## 2016-07-08 MED ORDER — KETOROLAC TROMETHAMINE 30 MG/ML IJ SOLN
30.0000 mg | Freq: Once | INTRAMUSCULAR | Status: AC
  Administered 2016-07-08: 30 mg via INTRAMUSCULAR
  Filled 2016-07-08: qty 1

## 2016-07-08 MED ORDER — CYCLOBENZAPRINE HCL 10 MG PO TABS: 10.0000 mg | ORAL_TABLET | Freq: Two times a day (BID) | ORAL | 0 refills | Status: DC | PRN

## 2016-07-08 NOTE — ED Triage Notes (Addendum)
Pt c/o right knee pain, right flank pain, headache and B/L shoulder pain for 2 days. Pt reports that she hit her right knee with a hammer a long time ago. Pt denies N/V or urinary symptoms.

## 2016-07-08 NOTE — ED Provider Notes (Signed)
[facility] DEPT Provider Note   CSN: [id number] Arrival date & time: 07/08/16  1547     History   Chief Complaint Chief Complaint  Patient presents with  . Knee Pain  . Flank Pain  . Headache  . Shoulder Pain    HPI Tiffany Blake is a 51 y.o. female.  HPI History of DM, HLD, asthma and chronic migraine headaches. Presents with bilateral shoulder pain, right knee pain and right flank pain, onset 2-3 days ago.  She has associating increased frequency of urine x 1-2 days without dysuria.  Shoulder pain bilaterally for 2 days, without radiation, sharp, worse with movement. No fall or trauma.  Right knee pain for same time, without injury or fall. Pain worse with knee extension and walking.  Took 2 ibuprofen without relief in her pain. No numbness or weakness focally. Has been able to walk steadily. No bowel or urinary incontinence.   Past Medical History:  Diagnosis Date  . Asthma   . Depression    on meds  . Drug abuse 2002  . Headache(784.0)   . HSV (herpes simplex virus) infection   . Muscle spasms of head or neck   . Ovarian cyst   . Sleep apnea     Patient Active Problem List   Diagnosis Date Noted  . Menorrhagia 05/23/2011    Past Surgical History:  Procedure Laterality Date  . CESAREAN SECTION    . TUBAL LIGATION      OB History    Gravida Para Term Preterm AB Living   5 5 5  0 0 5   SAB TAB Ectopic Multiple Live Births   0 0 0 0 5       Home Medications    Prior to Admission medications   Medication Sig Start Date End Date Taking? Authorizing Provider  albuterol (PROVENTIL HFA;VENTOLIN HFA) 108 (90 BASE) MCG/ACT inhaler Inhale 2 puffs into the lungs every 6 (six) hours as needed for wheezing or shortness of breath. 04/06/14  Yes Jennifer Piepenbrink, PA-C  chlordiazePOXIDE (LIBRIUM) 25 MG capsule 50mg  PO TID x 1D, then 25-50mg  PO BID X 1D, then 25-50mg  PO QD X 1D 10/05/15  Yes Jola Schmidt, MD  docusate sodium (COLACE) 100 MG capsule Take 1  capsule (100 mg total) by mouth every 12 (twelve) hours. 09/14/15  Yes Alexandra M Law, PA-C  FLUoxetine (PROZAC) 20 MG capsule Take 40 mg by mouth daily.   Yes Historical Provider, MD  gemfibrozil (LOPID) 600 MG tablet Take 600 mg by mouth 2 (two) times daily. 05/16/16  Yes Historical Provider, MD  metFORMIN (GLUCOPHAGE) 500 MG tablet Take 500 mg by mouth 2 (two) times daily with a meal.   Yes Historical Provider, MD  omeprazole (PRILOSEC) 40 MG capsule Take 40 mg by mouth daily. 05/16/16  Yes Historical Provider, MD  ondansetron (ZOFRAN ODT) 4 MG disintegrating tablet Take 1 tablet (4 mg total) by mouth every 8 (eight) hours as needed for nausea or vomiting. 04/18/15  Yes Nona Dell, PA-C  polyethylene glycol [facility] / GLYCOLAX) packet Take 17 g by mouth daily as needed for moderate constipation.   Yes Historical Provider, MD  promethazine (PHENERGAN) 25 MG tablet Take 1 tablet (25 mg total) by mouth every 6 (six) hours as needed for nausea or vomiting. 02/05/14  Yes Nicole Pisciotta, PA-C  SUMAtriptan (IMITREX) 50 MG tablet Take 50 mg by mouth daily as needed for migraine. 06/06/16  Yes Historical Provider, MD  topiramate (TOPAMAX) 50 MG tablet Take  50 mg by mouth 2 (two) times daily. Reported on 09/14/2015   Yes Historical Provider, MD  traZODone (DESYREL) 150 MG tablet Take 150 mg by mouth at bedtime. 04/05/16  Yes Historical Provider, MD  cyclobenzaprine (FLEXERIL) 10 MG tablet Take 1 tablet (10 mg total) by mouth 2 (two) times daily as needed for muscle spasms. 07/08/16   Forde Dandy, MD  naproxen (NAPROSYN) 375 MG tablet Take 1 tablet (375 mg total) by mouth 2 (two) times daily with a meal. 07/08/16   Forde Dandy, MD    Family History Family History  Problem Relation Age of Onset  . Diabetes Mother   . Alcohol abuse Father   . Liver disease Father   . Anesthesia problems Neg Hx   . Hypotension Neg Hx   . Malignant hyperthermia Neg Hx   . Pseudochol deficiency Neg Hx      Social History Social History  Substance Use Topics  . Smoking status: Never Smoker  . Smokeless tobacco: Never Used  . Alcohol use No     Allergies   Patient has no known allergies.   Review of Systems Review of Systems  Constitutional: Negative for fever.  Respiratory: Negative for shortness of breath.   Cardiovascular: Negative for chest pain.  Gastrointestinal: Negative for nausea and vomiting.  Genitourinary: Positive for flank pain. Negative for dysuria.  Musculoskeletal: Positive for arthralgias.  Skin: Negative for wound.  Allergic/Immunologic: Negative for immunocompromised state.  Hematological: Does not bruise/bleed easily.  All other systems reviewed and are negative.    Physical Exam Updated Vital Signs BP (!) 115/56 (BP Location: Right Arm)   Pulse 75   Temp 98.2 F (36.8 C) (Oral)   Resp 18   Ht 5\' 2"  (1.575 m)   Wt 258 lb (117 kg)   LMP 07/29/2012   SpO2 100%   BMI 47.19 kg/m   Physical Exam Physical Exam  Nursing note and vitals reviewed. Constitutional: Well developed, well nourished, non-toxic, and in no acute distress Head: Normocephalic and atraumatic.  Mouth/Throat: Oropharynx is clear and moist.  Neck: Normal range of motion. Neck supple.  Cardiovascular: Normal rate and regular rhythm.   Pulmonary/Chest: Effort normal and breath sounds normal. +2 DP and radial pulses Abdominal: Soft. There is no tenderness. There is no rebound and no guarding.  Musculoskeletal: No deformities. Right knee with minimal swelling and no redness. ROM full but with pain. Bilateral shoulder ROM full w/o deformities or swelling or overlying skin changes. Pain with ROM. TTP over right lumbar paraspinal muscles. Neurological: Alert, no facial droop, fluent speech, moves all extremities symmetrically, sensation to light touch in tact throughout, normal non-ataxic gait Skin: Skin is warm and dry.  Psychiatric: Cooperative   ED Treatments / Results   Labs (all labs ordered are listed, but only abnormal results are displayed) Labs Reviewed  CBC WITH DIFFERENTIAL/PLATELET - Abnormal; Notable for the following:       Result Value   Hemoglobin 11.4 (*)    HCT 35.4 (*)    All other components within normal limits  BASIC METABOLIC PANEL - Abnormal; Notable for the following:    Sodium 133 (*)    Glucose, Bld 101 (*)    Calcium 8.5 (*)    All other components within normal limits  URINALYSIS, ROUTINE W REFLEX MICROSCOPIC    EKG  EKG Interpretation None       Radiology No results found.  Procedures Procedures (including critical care time)  Medications Ordered in  ED Medications  ketorolac (TORADOL) 30 MG/ML injection 30 mg (30 mg Intramuscular Given 07/08/16 1728)  methocarbamol (ROBAXIN) tablet 1,000 mg (1,000 mg Oral Given 07/08/16 1730)     Initial Impression / Assessment and Plan / ED Course  I have reviewed the triage vital signs and the nursing notes.  Pertinent labs & imaging results that were available during my care of the patient were reviewed by me and considered in my medical decision making (see chart for details).      I reviewed the available records in [facility] and also Care Everywhere. Patient seen in ED and by PCP for bilateral shoulder pain and right knee pain in past. X-rays has shown arthritis. No new fall or trauma and has been on feet more. Minimal swelling of the right knee but ambulatory and full ROM. No overlying redness, fever, or concerns for septic arthritis. ROM also full in bilateral shoulders. She has paraspinal left lumbar spine tenderness to palpation, consistent with muscular pain. Abdomen is soft and benign, and history not suggestive of serious retroperitoneal or intra-abdominal processes. Urinalysis is normal. Discussed management for musculoskeletal pain and arthritis. She did receive Toradol and Robaxin in the ED. I discussed ongoing outpatient management by her PCP and continue physical  therapy. Strict return and follow-up instructions reviewed. She expressed understanding of all discharge instructions and felt comfortable with the plan of care.    Final Clinical Impressions(s) / ED Diagnoses   Final diagnoses:  Chronic pain of both shoulders  Acute pain of right knee  Musculoskeletal back pain    New Prescriptions New Prescriptions   CYCLOBENZAPRINE (FLEXERIL) 10 MG TABLET    Take 1 tablet (10 mg total) by mouth 2 (two) times daily as needed for muscle spasms.   NAPROXEN (NAPROSYN) 375 MG TABLET    Take 1 tablet (375 mg total) by mouth 2 (two) times daily with a meal.     Forde Dandy, MD 07/08/16 [phone]

## 2016-07-08 NOTE — Discharge Instructions (Signed)
You have previous arthritis in the shoulders and right knee on previous x-rays. Please follow-up with your primary care doctors about this and continue physical therapy. You may need referral to orthopedic doctor by your PCP.  Take anti-inflammatory medications and muscle relaxants as prescribed.  Return for worsening symptoms, including fever, intractable vomiting, or any other symptoms concerning to you.

## 2016-08-23 ENCOUNTER — Emergency Department (HOSPITAL_COMMUNITY)
Admission: EM | Admit: 2016-08-23 | Discharge: 2016-08-23 | Disposition: A | Discharge status: Home / Self Care | Payer: Medicare Other | Attending: Physician Assistant | Admitting: Physician Assistant

## 2016-08-23 ENCOUNTER — Encounter (HOSPITAL_COMMUNITY): Payer: Self-pay | Admitting: Emergency Medicine

## 2016-08-23 DIAGNOSIS — R1084 Generalized abdominal pain: Secondary | ICD-10-CM | POA: Diagnosis present

## 2016-08-23 DIAGNOSIS — Z5321 Procedure and treatment not carried out due to patient leaving prior to being seen by health care provider: Secondary | ICD-10-CM | POA: Diagnosis not present

## 2016-08-23 LAB — COMPREHENSIVE METABOLIC PANEL
ALBUMIN: 3.4 g/dL — AB (ref 3.5–5.0)
ALT: 11 U/L — ABNORMAL LOW (ref 14–54)
ANION GAP: 9 (ref 5–15)
AST: 13 U/L — ABNORMAL LOW (ref 15–41)
Alkaline Phosphatase: 76 U/L (ref 38–126)
BUN: 16 mg/dL (ref 6–20)
CHLORIDE: 107 mmol/L (ref 101–111)
CO2: 24 mmol/L (ref 22–32)
Calcium: 9.1 mg/dL (ref 8.9–10.3)
Creatinine, Ser: 1.03 mg/dL — ABNORMAL HIGH (ref 0.44–1.00)
GFR calc non Af Amer: >60 mL/min (ref >60)
GLUCOSE: 94 mg/dL (ref 65–99)
POTASSIUM: 3.5 mmol/L (ref 3.5–5.1)
Sodium: 140 mmol/L (ref 135–145)
Total Bilirubin: 0.7 mg/dL (ref 0.3–1.2)
Total Protein: 6.8 g/dL (ref 6.5–8.1)

## 2016-08-23 LAB — CBC
HEMATOCRIT: 39.2 % (ref 36.0–46.0)
HEMOGLOBIN: 12.3 g/dL (ref 12.0–15.0)
MCH: 28.3 pg (ref 26.0–34.0)
MCHC: 31.4 g/dL (ref 30.0–36.0)
MCV: 90.1 fL (ref 78.0–100.0)
Platelets: 298 10*3/uL (ref 150–400)
RBC: 4.35 MIL/uL (ref 3.87–5.11)
RDW: 13 % (ref 11.5–15.5)
WBC: 6 10*3/uL (ref 4.0–10.5)

## 2016-08-23 LAB — URINALYSIS, ROUTINE W REFLEX MICROSCOPIC
BILIRUBIN URINE: NEGATIVE
Glucose, UA: NEGATIVE mg/dL
Hgb urine dipstick: NEGATIVE
KETONES UR: NEGATIVE mg/dL
Nitrite: NEGATIVE
PROTEIN: NEGATIVE mg/dL
Specific Gravity, Urine: 1.024 (ref 1.005–1.030)
pH: 6 (ref 5.0–8.0)

## 2016-08-23 LAB — LIPASE, BLOOD: LIPASE: 22 U/L (ref 11–51)

## 2016-08-23 NOTE — ED Notes (Signed)
Called pt's name to be moved back to room to see doctor. No one answered. This tech does not see pt in lobby. Charge nurse notified.

## 2016-08-23 NOTE — ED Triage Notes (Signed)
Patient reports chronic generalized abdominal pain for several months with occasional emesis and diarrhea , denies fever or chills , no dysuria .

## 2016-08-23 NOTE — ED Notes (Signed)
Called pt's name to move back to room, no one answered. This tech does not see pt in lobby.

## 2016-10-05 ENCOUNTER — Emergency Department (HOSPITAL_COMMUNITY)
Admission: EM | Admit: 2016-10-05 | Discharge: 2016-10-06 | Disposition: A | Discharge status: Home / Self Care | Payer: Medicare Other | Attending: Emergency Medicine | Admitting: Emergency Medicine

## 2016-10-05 ENCOUNTER — Encounter (HOSPITAL_COMMUNITY): Payer: Self-pay

## 2016-10-05 DIAGNOSIS — Z3202 Encounter for pregnancy test, result negative: Secondary | ICD-10-CM | POA: Diagnosis not present

## 2016-10-05 DIAGNOSIS — R21 Rash and other nonspecific skin eruption: Secondary | ICD-10-CM | POA: Insufficient documentation

## 2016-10-05 DIAGNOSIS — J45909 Unspecified asthma, uncomplicated: Secondary | ICD-10-CM | POA: Insufficient documentation

## 2016-10-05 DIAGNOSIS — Z7984 Long term (current) use of oral hypoglycemic drugs: Secondary | ICD-10-CM | POA: Insufficient documentation

## 2016-10-05 DIAGNOSIS — Z79899 Other long term (current) drug therapy: Secondary | ICD-10-CM | POA: Insufficient documentation

## 2016-10-05 NOTE — ED Triage Notes (Addendum)
Pt BIB [facility] from home. She reports itching all over since yesterday after she stayed at a friends house. No SOB or any other symptoms. No bites visualized. A&Ox4. Ambulatory  Pt also c/o her face hurting where she hit it when she fell yesterday.  Pt also requesting a pregnancy test.

## 2016-10-06 DIAGNOSIS — R21 Rash and other nonspecific skin eruption: Secondary | ICD-10-CM | POA: Diagnosis not present

## 2016-10-06 LAB — POC URINE PREG, ED: Preg Test, Ur: NEGATIVE

## 2016-10-06 MED ORDER — HYDROXYZINE HCL 25 MG PO TABS: 25.0000 mg | ORAL_TABLET | Freq: Four times a day (QID) | ORAL | 0 refills | Status: DC

## 2016-10-06 MED ORDER — HYDROXYZINE HCL 25 MG PO TABS
25.0000 mg | ORAL_TABLET | Freq: Once | ORAL | Status: AC
  Administered 2016-10-06: 25 mg via ORAL
  Filled 2016-10-06: qty 1

## 2016-10-06 MED ORDER — PERMETHRIN 5 % EX CREA: TOPICAL_CREAM | CUTANEOUS | 1 refills | Status: DC

## 2016-10-06 NOTE — Discharge Instructions (Signed)
The pregnancy test was negative.  Use the hydroxyzine as needed for itching. Use caution as the hydroxyzine can make you drowsy.  Apply the permethrin to the entire body and rinse off after 8-14 hours. Repeat in 1 week. Wash all clothing and bedding in as hot of water possible.   Follow up with a primary care provider or dermatologist should symptoms fail to resolve.

## 2016-10-06 NOTE — ED Provider Notes (Signed)
[facility] DEPT Provider Note   CSN: [id number] Arrival date & time: 10/05/16  2332     History   Chief Complaint Chief Complaint  Patient presents with  . Urticaria  . Possible Pregnancy    HPI Tiffany Blake is a 51 y.o. female.  HPI    Daily Doe is a 51 y.o. female, with a history of drug abuse, presenting to the ED with complaint of itching. Patient states she has had itching "all over" beginning yesterday after she stayed over at a friend's house. The itching began on her legs and spread from there. She states she does not know if anyone else has similar symptoms. She has not seen any insects. She denies any changes in in skin care products or detergents. Patient denies alcohol or illicit drug use.  Also requesting a pregnancy test "just to be sure." I asked her if this had anything to do with her staying over at a "friend's house" recently, but she would not say one way or the other. She will not answer questions about sexual activity.  States she does not have regular periods. Can not say when her last period was.   Denies vaginal bleeding, abdominal pain, nausea/vomiting, breast changes, painful skin lesions, fever/chills, any other complaints.   Past Medical History:  Diagnosis Date  . Asthma   . Depression    on meds  . Drug abuse 2002  . Headache(784.0)   . HSV (herpes simplex virus) infection   . Muscle spasms of head or neck   . Ovarian cyst   . Sleep apnea     Patient Active Problem List   Diagnosis Date Noted  . Menorrhagia 05/23/2011    Past Surgical History:  Procedure Laterality Date  . CESAREAN SECTION    . TUBAL LIGATION      OB History    Gravida Para Term Preterm AB Living   5 5 5  0 0 5   SAB TAB Ectopic Multiple Live Births   0 0 0 0 5       Home Medications    Prior to Admission medications   Medication Sig Start Date End Date Taking? Authorizing Provider  albuterol (PROVENTIL HFA;VENTOLIN HFA) 108 (90 BASE) MCG/ACT  inhaler Inhale 2 puffs into the lungs every 6 (six) hours as needed for wheezing or shortness of breath. 04/06/14   Piepenbrink, Anderson Malta, PA-C  chlordiazePOXIDE (LIBRIUM) 25 MG capsule 50mg  PO TID x 1D, then 25-50mg  PO BID X 1D, then 25-50mg  PO QD X 1D 10/05/15   Jola Schmidt, MD  cyclobenzaprine (FLEXERIL) 10 MG tablet Take 1 tablet (10 mg total) by mouth 2 (two) times daily as needed for muscle spasms. 07/08/16   Forde Dandy, MD  docusate sodium (COLACE) 100 MG capsule Take 1 capsule (100 mg total) by mouth every 12 (twelve) hours. 09/14/15   Law, Bea Graff, PA-C  FLUoxetine (PROZAC) 20 MG capsule Take 40 mg by mouth daily.    [provider]  gemfibrozil (LOPID) 600 MG tablet Take 600 mg by mouth 2 (two) times daily. 05/16/16   [provider]  hydrOXYzine (ATARAX/VISTARIL) 25 MG tablet Take 1 tablet (25 mg total) by mouth every 6 (six) hours. 10/06/16   Thijs Brunton C, PA-C  metFORMIN (GLUCOPHAGE) 500 MG tablet Take 500 mg by mouth 2 (two) times daily with a meal.    [provider]  naproxen (NAPROSYN) 375 MG tablet Take 1 tablet (375 mg total) by mouth 2 (two) times daily  with a meal. 07/08/16   Forde Dandy, MD  omeprazole (PRILOSEC) 40 MG capsule Take 40 mg by mouth daily. 05/16/16   [provider]  ondansetron (ZOFRAN ODT) 4 MG disintegrating tablet Take 1 tablet (4 mg total) by mouth every 8 (eight) hours as needed for nausea or vomiting. 04/18/15   Nona Dell, PA-C  permethrin (ELIMITE) 5 % cream Apply to the entire body. Rinse off completely after 8-14 hours. Repeat in 1 week. 10/06/16   Melika Reder C, PA-C  polyethylene glycol (MIRALAX / GLYCOLAX) packet Take 17 g by mouth daily as needed for moderate constipation.    [provider]  promethazine (PHENERGAN) 25 MG tablet Take 1 tablet (25 mg total) by mouth every 6 (six) hours as needed for nausea or vomiting. 02/05/14   Pisciotta, Elmyra Ricks, PA-C  SUMAtriptan (IMITREX) 50 MG tablet Take  50 mg by mouth daily as needed for migraine. 06/06/16   [provider]  topiramate (TOPAMAX) 50 MG tablet Take 50 mg by mouth 2 (two) times daily. Reported on 09/14/2015    [provider]  traZODone (DESYREL) 150 MG tablet Take 150 mg by mouth at bedtime. 04/05/16   [provider]    Family History Family History  Problem Relation Age of Onset  . Diabetes Mother   . Alcohol abuse Father   . Liver disease Father   . Anesthesia problems Neg Hx   . Hypotension Neg Hx   . Malignant hyperthermia Neg Hx   . Pseudochol deficiency Neg Hx     Social History Social History  Substance Use Topics  . Smoking status: Never Smoker  . Smokeless tobacco: Never Used  . Alcohol use No     Allergies   Patient has no known allergies.   Review of Systems Review of Systems  Constitutional: Negative for chills and fever.  HENT: Negative for facial swelling, sore throat, trouble swallowing and voice change.   Respiratory: Negative for shortness of breath.   Gastrointestinal: Negative for abdominal pain, nausea and vomiting.  Skin: Positive for rash.  All other systems reviewed and are negative.    Physical Exam Updated Vital Signs BP 127/71 (BP Location: Left Arm)   Pulse 80   Temp 98 F (36.7 C) (Oral)   Resp 18   LMP 07/29/2012   SpO2 98%   Physical Exam  Constitutional: She appears well-developed and well-nourished. No distress.  HENT:  Head: Normocephalic and atraumatic.  Mouth/Throat: Oropharynx is clear and moist.  No facial or intraoral swelling noted. No intraoral lesions noted.  Eyes: Pupils are equal, round, and reactive to light. Conjunctivae and EOM are normal.  Neck: Normal range of motion. Neck supple.  Cardiovascular: Normal rate, regular rhythm, normal heart sounds and intact distal pulses.   Pulmonary/Chest: Effort normal and breath sounds normal. No respiratory distress.  Abdominal: Soft. She exhibits no distension. There is no  tenderness. There is no guarding.  Musculoskeletal: She exhibits no edema.  Lymphadenopathy:    She has no cervical adenopathy.  Neurological: She is alert.  Skin: Skin is warm and dry. Rash noted. She is not diaphoretic.  Scattered, fine, erythematous, maculopapular rash noted to the patient's lower legs, arms, and torso. No vesicles or pustules noted. Spares the palms and soles. No noted interdigital lesions.  Psychiatric: She has a normal mood and affect. Her behavior is normal.  Nursing note and vitals reviewed.    ED Treatments / Results  Labs (all labs ordered are  listed, but only abnormal results are displayed) Labs Reviewed  POC URINE PREG, ED    EKG  EKG Interpretation None       Radiology No results found.  Procedures Procedures (including critical care time)  Medications Ordered in ED Medications  hydrOXYzine (ATARAX/VISTARIL) tablet 25 mg (25 mg Oral Given 10/06/16 0025)     Initial Impression / Assessment and Plan / ED Course  I have reviewed the triage vital signs and the nursing notes.  Pertinent labs & imaging results that were available during my care of the patient were reviewed by me and considered in my medical decision making (see chart for details).     Patient presents with complaint of a pruritic rash. Suspicion for possible bedbug exposure, however, scabies is not an impossibility. Will treat the itching with hydroxyzine and add permethrin just in case. Patient will need PCP follow-up. Resources discussed. The patient was given further instructions for home care as well as return precautions. Patient voices understanding of these instructions, accepts the plan, and is comfortable with discharge.  Pregnancy test negative.  Final Clinical Impressions(s) / ED Diagnoses   Final diagnoses:  Rash  Negative pregnancy test    New Prescriptions New Prescriptions   HYDROXYZINE (ATARAX/VISTARIL) 25 MG TABLET    Take 1 tablet (25 mg total) by  mouth every 6 (six) hours.   PERMETHRIN (ELIMITE) 5 % CREAM    Apply to the entire body. Rinse off completely after 8-14 hours. Repeat in 1 week.     Lorayne Bender, PA-C 10/06/16 0033    Lorayne Bender, PA-C 10/06/16 0038    Lorayne Bender, PA-C 10/06/16 0044    Carmin Muskrat, MD 10/06/16 [phone]

## 2016-11-05 ENCOUNTER — Encounter (HOSPITAL_COMMUNITY): Payer: Self-pay | Admitting: Emergency Medicine

## 2016-11-05 ENCOUNTER — Emergency Department (HOSPITAL_COMMUNITY): Payer: Medicare Other

## 2016-11-05 ENCOUNTER — Emergency Department (HOSPITAL_COMMUNITY)
Admission: EM | Admit: 2016-11-05 | Discharge: 2016-11-06 | Disposition: A | Discharge status: Home / Self Care | Payer: Medicare Other | Attending: Emergency Medicine | Admitting: Emergency Medicine

## 2016-11-05 DIAGNOSIS — Z7984 Long term (current) use of oral hypoglycemic drugs: Secondary | ICD-10-CM | POA: Diagnosis not present

## 2016-11-05 DIAGNOSIS — Z79899 Other long term (current) drug therapy: Secondary | ICD-10-CM | POA: Diagnosis not present

## 2016-11-05 DIAGNOSIS — R079 Chest pain, unspecified: Secondary | ICD-10-CM | POA: Diagnosis present

## 2016-11-05 DIAGNOSIS — D150 Benign neoplasm of thymus: Secondary | ICD-10-CM | POA: Diagnosis not present

## 2016-11-05 DIAGNOSIS — D4989 Neoplasm of unspecified behavior of other specified sites: Secondary | ICD-10-CM

## 2016-11-05 DIAGNOSIS — R0789 Other chest pain: Secondary | ICD-10-CM | POA: Diagnosis not present

## 2016-11-05 LAB — BASIC METABOLIC PANEL
ANION GAP: 6 (ref 5–15)
BUN: 21 mg/dL — AB (ref 6–20)
CHLORIDE: 104 mmol/L (ref 101–111)
CO2: 26 mmol/L (ref 22–32)
Calcium: 8.7 mg/dL — ABNORMAL LOW (ref 8.9–10.3)
Creatinine, Ser: 1.03 mg/dL — ABNORMAL HIGH (ref 0.44–1.00)
GFR calc Af Amer: >60 mL/min (ref >60)
GLUCOSE: 101 mg/dL — AB (ref 65–99)
POTASSIUM: 4.2 mmol/L (ref 3.5–5.1)
Sodium: 136 mmol/L (ref 135–145)

## 2016-11-05 LAB — CBC
HEMATOCRIT: 37.2 % (ref 36.0–46.0)
HEMOGLOBIN: 11.9 g/dL — AB (ref 12.0–15.0)
MCH: 28.8 pg (ref 26.0–34.0)
MCHC: 32 g/dL (ref 30.0–36.0)
MCV: 90.1 fL (ref 78.0–100.0)
Platelets: 215 10*3/uL (ref 150–400)
RBC: 4.13 MIL/uL (ref 3.87–5.11)
RDW: 12.7 % (ref 11.5–15.5)
WBC: 6.5 10*3/uL (ref 4.0–10.5)

## 2016-11-05 LAB — I-STAT TROPONIN, ED: Troponin i, poc: 0 ng/mL (ref 0.00–0.08)

## 2016-11-05 NOTE — ED Triage Notes (Signed)
Pt presents to ED for assessment of right arm and shoulder and neck pain, hand tingling/numbness, left foot swelling, intermittent chest pain, shortness of breath, blurred vision and weakness.

## 2016-11-06 ENCOUNTER — Emergency Department (HOSPITAL_COMMUNITY): Payer: Medicare Other

## 2016-11-06 DIAGNOSIS — R0789 Other chest pain: Secondary | ICD-10-CM | POA: Diagnosis not present

## 2016-11-06 LAB — HEPATIC FUNCTION PANEL
ALBUMIN: 3.6 g/dL (ref 3.5–5.0)
ALK PHOS: 63 U/L (ref 38–126)
ALT: 13 U/L — ABNORMAL LOW (ref 14–54)
AST: 16 U/L (ref 15–41)
BILIRUBIN TOTAL: 0.7 mg/dL (ref 0.3–1.2)
Bilirubin, Direct: <0.1 mg/dL — ABNORMAL LOW (ref 0.1–0.5)
TOTAL PROTEIN: 6.7 g/dL (ref 6.5–8.1)

## 2016-11-06 LAB — I-STAT TROPONIN, ED: Troponin i, poc: 0 ng/mL (ref 0.00–0.08)

## 2016-11-06 LAB — D-DIMER, QUANTITATIVE (NOT AT ARMC): D DIMER QUANT: 0.52 ug{FEU}/mL — AB (ref 0.00–0.50)

## 2016-11-06 MED ORDER — NAPROXEN 250 MG PO TABS
500.0000 mg | ORAL_TABLET | Freq: Once | ORAL | Status: AC
  Administered 2016-11-06: 500 mg via ORAL
  Filled 2016-11-06: qty 2

## 2016-11-06 MED ORDER — ACETAMINOPHEN 325 MG PO TABS: 650.0000 mg | ORAL_TABLET | Freq: Four times a day (QID) | ORAL | 0 refills | Status: DC | PRN

## 2016-11-06 MED ORDER — ACETAMINOPHEN 325 MG PO TABS
650.0000 mg | ORAL_TABLET | Freq: Once | ORAL | Status: AC
  Administered 2016-11-06: 650 mg via ORAL
  Filled 2016-11-06: qty 2

## 2016-11-06 MED ORDER — IOPAMIDOL (ISOVUE-370) INJECTION 76%
INTRAVENOUS | Status: AC
  Administered 2016-11-06: 100 mL
  Filled 2016-11-06: qty 100

## 2016-11-06 NOTE — ED Notes (Signed)
Pt going to CT

## 2016-11-06 NOTE — Discharge Instructions (Signed)
°  All the results in the ER are normal, labs and imaging. We are not sure what is causing your symptoms.  We did find a possible thymus enlargement-  and so we are asking you to follow that up with the CT surgeons.  The workup in the ER is not complete, and is limited to screening for life threatening and emergent conditions only, so please see a primary care doctor for further evaluation.

## 2016-11-06 NOTE — ED Provider Notes (Signed)
[facility] DEPT Provider Note   CSN: [id number] Arrival date & time: 11/05/16  2117     History   Chief Complaint Chief Complaint  Patient presents with  . Arm Pain  . Blurred Vision    HPI Tiffany Blake is a 51 y.o. female.  HPI Pt comes in with cc of chest pai, R arm pain, neck and back pain. Pt has hx of asthma, DM. She reports that she has been having back pain, R chest pain, R shoulder pain - all worse with inspiration. Pt has mild cough. Pt at some point had a headache, blurred vision -but it is now resolved. Pt has no hx of PE, DVT and denies any exogenous hormone (testosterone / estrogen) use, long distance travels or surgery in the past 6 weeks, active cancer, recent immobilization. Pt does have intermittent LLE swelling.  Past Medical History:  Diagnosis Date  . Asthma   . Depression    on meds  . Drug abuse 2002  . Headache(784.0)   . HSV (herpes simplex virus) infection   . Muscle spasms of head or neck   . Ovarian cyst   . Sleep apnea     Patient Active Problem List   Diagnosis Date Noted  . Menorrhagia 05/23/2011    Past Surgical History:  Procedure Laterality Date  . CESAREAN SECTION    . TUBAL LIGATION      OB History    Gravida Para Term Preterm AB Living   5 5 5  0 0 5   SAB TAB Ectopic Multiple Live Births   0 0 0 0 5       Home Medications    Prior to Admission medications   Medication Sig Start Date End Date Taking? Authorizing Provider  albuterol (PROVENTIL HFA;VENTOLIN HFA) 108 (90 BASE) MCG/ACT inhaler Inhale 2 puffs into the lungs every 6 (six) hours as needed for wheezing or shortness of breath. 04/06/14  Yes Piepenbrink, Anderson Malta, PA-C  FLUoxetine (PROZAC) 20 MG capsule Take 40 mg by mouth daily.   Yes [provider]  gemfibrozil (LOPID) 600 MG tablet Take 600 mg by mouth 2 (two) times daily. 05/16/16  Yes [provider]  hydrOXYzine (ATARAX/VISTARIL) 25 MG tablet Take 1 tablet (25 mg total) by mouth every  6 (six) hours. 10/06/16  Yes Joy, Shawn C, PA-C  metFORMIN (GLUCOPHAGE) 500 MG tablet Take 500 mg by mouth 2 (two) times daily with a meal.   Yes [provider]  omeprazole (PRILOSEC) 40 MG capsule Take 40 mg by mouth daily. 05/16/16  Yes [provider]  polyethylene glycol (MIRALAX / GLYCOLAX) packet Take 17 g by mouth daily as needed for moderate constipation.   Yes [provider]  SUMAtriptan (IMITREX) 50 MG tablet Take 50 mg by mouth daily as needed for migraine. 06/06/16  Yes [provider]  topiramate (TOPAMAX) 50 MG tablet Take 50 mg by mouth 2 (two) times daily. Reported on 09/14/2015   Yes [provider]  traZODone (DESYREL) 150 MG tablet Take 150 mg by mouth at bedtime. 04/05/16  Yes [provider]  acetaminophen (TYLENOL) 325 MG tablet Take 2 tablets (650 mg total) by mouth every 6 (six) hours as needed. 11/06/16   Varney Biles, MD  chlordiazePOXIDE (LIBRIUM) 25 MG capsule 50mg  PO TID x 1D, then 25-50mg  PO BID X 1D, then 25-50mg  PO QD X 1D Patient not taking: Reported on 11/06/2016 10/05/15   Jola Schmidt, MD  cyclobenzaprine (FLEXERIL) 10 MG tablet Take  1 tablet (10 mg total) by mouth 2 (two) times daily as needed for muscle spasms. Patient not taking: Reported on 11/06/2016 07/08/16   Forde Dandy, MD  docusate sodium (COLACE) 100 MG capsule Take 1 capsule (100 mg total) by mouth every 12 (twelve) hours. Patient not taking: Reported on 11/06/2016 09/14/15   Frederica Kuster, PA-C  naproxen (NAPROSYN) 375 MG tablet Take 1 tablet (375 mg total) by mouth 2 (two) times daily with a meal. Patient not taking: Reported on 11/06/2016 07/08/16   Forde Dandy, MD  ondansetron (ZOFRAN ODT) 4 MG disintegrating tablet Take 1 tablet (4 mg total) by mouth every 8 (eight) hours as needed for nausea or vomiting. Patient not taking: Reported on 11/06/2016 04/18/15   Nona Dell, PA-C  permethrin (ELIMITE) 5 % cream Apply to the entire body.  Rinse off completely after 8-14 hours. Repeat in 1 week. Patient not taking: Reported on 11/06/2016 10/06/16   Lorayne Bender, PA-C  promethazine (PHENERGAN) 25 MG tablet Take 1 tablet (25 mg total) by mouth every 6 (six) hours as needed for nausea or vomiting. Patient not taking: Reported on 11/06/2016 02/05/14   Pisciotta, Elmyra Ricks, PA-C    Family History Family History  Problem Relation Age of Onset  . Diabetes Mother   . Alcohol abuse Father   . Liver disease Father   . Anesthesia problems Neg Hx   . Hypotension Neg Hx   . Malignant hyperthermia Neg Hx   . Pseudochol deficiency Neg Hx     Social History Social History  Substance Use Topics  . Smoking status: Never Smoker  . Smokeless tobacco: Never Used  . Alcohol use No     Allergies   Patient has no known allergies.   Review of Systems Review of Systems  All other systems reviewed and are negative.    Physical Exam Updated Vital Signs BP 136/79   Pulse 63   Temp 97.9 F (36.6 C)   Resp 16   LMP 07/29/2012   SpO2 100%   Physical Exam  Constitutional: She is oriented to person, place, and time. She appears well-developed.  HENT:  Head: Normocephalic and atraumatic.  Eyes: EOM are normal.  Neck: Normal range of motion. Neck supple. No JVD present.  Cardiovascular: Normal rate.   Pulmonary/Chest: Effort normal. No respiratory distress. She has no wheezes.  Abdominal: Bowel sounds are normal.  Musculoskeletal: Normal range of motion. She exhibits no edema or tenderness.  R shoulder has mild tenderness, but she I am able to rotate the shoulder internally and externally and pt is able to abduct and forward flex.   Neurological: She is alert and oriented to person, place, and time.  Skin: Skin is warm and dry.  Nursing note and vitals reviewed.    ED Treatments / Results  Labs (all labs ordered are listed, but only abnormal results are displayed) Labs Reviewed  BASIC METABOLIC PANEL - Abnormal; Notable for  the following:       Result Value   Glucose, Bld 101 (*)    BUN 21 (*)    Creatinine, Ser 1.03 (*)    Calcium 8.7 (*)    All other components within normal limits  CBC - Abnormal; Notable for the following:    Hemoglobin 11.9 (*)    All other components within normal limits  HEPATIC FUNCTION PANEL - Abnormal; Notable for the following:    ALT 13 (*)    Bilirubin, Direct <0.1 (*)  All other components within normal limits  D-DIMER, QUANTITATIVE (NOT AT [facility]) - Abnormal; Notable for the following:    D-Dimer, Quant 0.52 (*)    All other components within normal limits  I-STAT TROPONIN, ED  I-STAT TROPONIN, ED    EKG  EKG Interpretation None     Date: 11/06/2016  Rate: 65  Rhythm: normal sinus rhythm  QRS Axis: normal  Intervals: normal  ST/T Wave abnormalities: normal  Conduction Disutrbances: none  Narrative Interpretation: unremarkable  Radiology Dg Chest 2 View  Result Date: 11/05/2016 CLINICAL DATA:  Right-sided chest pain EXAM: CHEST  2 VIEW COMPARISON:  10/04/2015 FINDINGS: The heart size and mediastinal contours are within normal limits. Both lungs are clear. The visualized skeletal structures are unremarkable. IMPRESSION: No active cardiopulmonary disease. Electronically Signed   By: Donavan Foil M.D.   On: 11/05/2016 22:16   Ct Angio Chest Pe W And/or Wo Contrast  Result Date: 11/06/2016 CLINICAL DATA:  51 y/o F; suspect pulmonary embolus, intermediate pretest probability, positive D-dimer. EXAM: CT ANGIOGRAPHY CHEST WITH CONTRAST TECHNIQUE: Multidetector CT imaging of the chest was performed using the standard protocol during bolus administration of intravenous contrast. Multiplanar CT image reconstructions and MIPs were obtained to evaluate the vascular anatomy. CONTRAST:  100 cc Isovue 370 COMPARISON:  11/04/2010 chest radiograph FINDINGS: Cardiovascular: Satisfactory opacification of the pulmonary arteries to the segmental level. No evidence of pulmonary  embolism. Normal heart size. No pericardial effusion. Mediastinum/Nodes: No enlarged mediastinal, hilar, or axillary lymph nodes. Thyroid gland, trachea, and esophagus demonstrate no significant findings. Anterior mediastinal soft tissue measuring 4.6 x 2.2 x 3.6 cm (AP x ML x CC series 6, image 33 and series 10, image 84) increased from prior CT of chest. The soft tissue is homogeneous in attenuation and not lobulated. Lungs/Pleura: Lungs are clear. No pleural effusion or pneumothorax. Upper Abdomen: No acute abnormality. Musculoskeletal: No chest wall abnormality. No acute or significant osseous findings. Review of the MIP images confirms the above findings. IMPRESSION: 1. No pulmonary embolus identified. 2. Clear lungs. 3. Anterior mediastinal soft tissue increased from prior CTA of the chest probably representing an enlarged thymus. The soft tissue is homogeneous and smoothly marginated. 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 No nodularity more typical of differential considerations such as thymoma, lymphoma, or germinoma which are considered less likely. Electronically Signed   By: Kristine Garbe M.D.   On: 11/06/2016 04:59    Procedures Procedures (including critical care time)        Medications Ordered in ED Medications  naproxen (NAPROSYN) tablet 500 mg (500 mg Oral Given 11/06/16 0220)  iopamidol (ISOVUE-370) 76 % injection (100 mLs  Contrast Given 11/06/16 0422)  acetaminophen (TYLENOL) tablet 650 mg (650 mg Oral Given 11/06/16 6568)     Initial Impression / Assessment and Plan / ED Course  I have reviewed the triage vital signs and the nursing notes.  Pertinent labs & imaging results that were available during my care of the patient were reviewed by me and considered in my medical decision making (see chart for details).     Pt comes in with multiple complains. Chest pain is pleuritic. We will get dimer, pt is low risk for PE. Trops x 2 ordered. Otelia Santee is not  typical for ACS. HEAR score is 3 for age and risk factors.  Final Clinical Impressions(s) / ED Diagnoses   Final diagnoses:  Atypical chest pain  Thymoma    New Prescriptions Discharge Medication List as of 11/06/2016  6:06  AM    START taking these medications   Details  acetaminophen (TYLENOL) 325 MG tablet Take 2 tablets (650 mg total) by mouth every 6 (six) hours as needed., Starting Tue 11/06/2016, Print         Varney Biles, MD 11/06/16 1019

## 2016-11-06 NOTE — ED Notes (Signed)
Pt given sandwich and ginger ale per request

## 2016-12-04 ENCOUNTER — Emergency Department (HOSPITAL_COMMUNITY)
Admission: EM | Admit: 2016-12-04 | Discharge: 2016-12-04 | Disposition: A | Discharge status: Home / Self Care | Payer: Medicare Other | Attending: Emergency Medicine | Admitting: Emergency Medicine

## 2016-12-04 ENCOUNTER — Emergency Department (HOSPITAL_COMMUNITY): Payer: Medicare Other

## 2016-12-04 ENCOUNTER — Encounter (HOSPITAL_COMMUNITY): Payer: Self-pay

## 2016-12-04 DIAGNOSIS — Z79899 Other long term (current) drug therapy: Secondary | ICD-10-CM | POA: Diagnosis not present

## 2016-12-04 DIAGNOSIS — J45909 Unspecified asthma, uncomplicated: Secondary | ICD-10-CM | POA: Insufficient documentation

## 2016-12-04 DIAGNOSIS — R51 Headache: Secondary | ICD-10-CM | POA: Insufficient documentation

## 2016-12-04 DIAGNOSIS — Z7984 Long term (current) use of oral hypoglycemic drugs: Secondary | ICD-10-CM | POA: Diagnosis not present

## 2016-12-04 DIAGNOSIS — E119 Type 2 diabetes mellitus without complications: Secondary | ICD-10-CM | POA: Insufficient documentation

## 2016-12-04 DIAGNOSIS — R0789 Other chest pain: Secondary | ICD-10-CM | POA: Diagnosis not present

## 2016-12-04 DIAGNOSIS — R079 Chest pain, unspecified: Secondary | ICD-10-CM | POA: Diagnosis present

## 2016-12-04 HISTORY — DX: Type 2 diabetes mellitus without complications: E11.9

## 2016-12-04 LAB — CBC
HEMATOCRIT: 37.3 % (ref 36.0–46.0)
HEMOGLOBIN: 12 g/dL (ref 12.0–15.0)
MCH: 28.6 pg (ref 26.0–34.0)
MCHC: 32.2 g/dL (ref 30.0–36.0)
MCV: 88.8 fL (ref 78.0–100.0)
Platelets: 253 10*3/uL (ref 150–400)
RBC: 4.2 MIL/uL (ref 3.87–5.11)
RDW: 12.8 % (ref 11.5–15.5)
WBC: 5.6 10*3/uL (ref 4.0–10.5)

## 2016-12-04 LAB — BASIC METABOLIC PANEL
Anion gap: 6 (ref 5–15)
BUN: 19 mg/dL (ref 6–20)
CALCIUM: 9 mg/dL (ref 8.9–10.3)
CO2: 22 mmol/L (ref 22–32)
CREATININE: 0.89 mg/dL (ref 0.44–1.00)
Chloride: 108 mmol/L (ref 101–111)
GFR calc Af Amer: >60 mL/min (ref >60)
Glucose, Bld: 99 mg/dL (ref 65–99)
POTASSIUM: 4 mmol/L (ref 3.5–5.1)
Sodium: 136 mmol/L (ref 135–145)

## 2016-12-04 LAB — I-STAT TROPONIN, ED: Troponin i, poc: 0 ng/mL (ref 0.00–0.08)

## 2016-12-04 MED ORDER — SODIUM CHLORIDE 0.9 % IV BOLUS (SEPSIS)
500.0000 mL | Freq: Once | INTRAVENOUS | Status: AC
  Administered 2016-12-04: 500 mL via INTRAVENOUS

## 2016-12-04 MED ORDER — DIPHENHYDRAMINE HCL 50 MG/ML IJ SOLN
25.0000 mg | Freq: Once | INTRAMUSCULAR | Status: AC
  Administered 2016-12-04: 25 mg via INTRAVENOUS
  Filled 2016-12-04: qty 1

## 2016-12-04 MED ORDER — METOCLOPRAMIDE HCL 5 MG/ML IJ SOLN
10.0000 mg | Freq: Once | INTRAMUSCULAR | Status: AC
  Administered 2016-12-04: 10 mg via INTRAVENOUS
  Filled 2016-12-04: qty 2

## 2016-12-04 MED ORDER — KETOROLAC TROMETHAMINE 15 MG/ML IJ SOLN
15.0000 mg | Freq: Once | INTRAMUSCULAR | Status: AC
  Administered 2016-12-04: 15 mg via INTRAVENOUS
  Filled 2016-12-04: qty 1

## 2016-12-04 MED ORDER — ACETAMINOPHEN 325 MG PO TABS: 650.0000 mg | ORAL_TABLET | Freq: Four times a day (QID) | ORAL | 0 refills | Status: AC | PRN

## 2016-12-04 NOTE — ED Provider Notes (Signed)
[facility] DEPT Provider Note   CSN: [id number] Arrival date & time: 12/04/16  0747  History   Chief Complaint Chief Complaint  Patient presents with  . cp/headache   HPI  Tiffany Blake is a 51yo female with PMH significant for depression, OSA, and DM2 who presents with headache and chest pain since Saturday. She states the chest pain began at rest and is a constant 10/10 sharp right-sided pain. It radiates around her side to the back and sometimes up her neck. It sometimes hurts worse with inspiration, associated with nausea and no emesis, shortness of breath, and feeling warm. Does endorse some palpitations. She has tried advil, which somewhat helped relieve the pain. Denies trauma or falling. Denies fevers, abdominal pain, changes in BMs, or dysuria. No recent travel or sick contacts. Denies history of clots or taking any hormones. Did travel to [facility] for a day in the middle of August. Denies LE swelling.  The headache also started Saturday and is a throbbing 10/10 constant frontal headache. Somewhat associated with photophobia and phonophobia. She does not normally get headaches. Denies focal weakness or dysarthria. The advil did not help relieve her headache.  Denies smoking, alcohol use, or other illicit drugs. Lives at home by herself normally but is living with caretaker right now.  Past Medical History:  Diagnosis Date  . Asthma   . Depression    on meds  . Diabetes mellitus without complication ([place])   . Drug abuse 2002  . Headache(784.0)   . HSV (herpes simplex virus) infection   . Muscle spasms of head or neck   . Ovarian cyst   . Sleep apnea     Patient Active Problem List   Diagnosis Date Noted  . Menorrhagia 05/23/2011    Past Surgical History:  Procedure Laterality Date  . CESAREAN SECTION    . TUBAL LIGATION      OB History    Gravida Para Term Preterm AB Living   5 5 5  0 0 5   SAB TAB Ectopic Multiple Live Births   0 0 0 0 5       Home  Medications    Prior to Admission medications   Medication Sig Start Date End Date Taking? Authorizing Provider  acetaminophen (TYLENOL) 325 MG tablet Take 2 tablets (650 mg total) by mouth every 6 (six) hours as needed. 11/06/16   Varney Biles, MD  albuterol (PROVENTIL HFA;VENTOLIN HFA) 108 (90 BASE) MCG/ACT inhaler Inhale 2 puffs into the lungs every 6 (six) hours as needed for wheezing or shortness of breath. 04/06/14   Piepenbrink, Anderson Malta, PA-C  chlordiazePOXIDE (LIBRIUM) 25 MG capsule 50mg  PO TID x 1D, then 25-50mg  PO BID X 1D, then 25-50mg  PO QD X 1D Patient not taking: Reported on 11/06/2016 10/05/15   Jola Schmidt, MD  cyclobenzaprine (FLEXERIL) 10 MG tablet Take 1 tablet (10 mg total) by mouth 2 (two) times daily as needed for muscle spasms. Patient not taking: Reported on 11/06/2016 07/08/16   Forde Dandy, MD  docusate sodium (COLACE) 100 MG capsule Take 1 capsule (100 mg total) by mouth every 12 (twelve) hours. Patient not taking: Reported on 11/06/2016 09/14/15   Frederica Kuster, PA-C  FLUoxetine (PROZAC) 20 MG capsule Take 40 mg by mouth daily.    [provider]  gemfibrozil (LOPID) 600 MG tablet Take 600 mg by mouth 2 (two) times daily. 05/16/16   [provider]  hydrOXYzine (ATARAX/VISTARIL) 25 MG tablet Take 1 tablet (25 mg  total) by mouth every 6 (six) hours. 10/06/16   Joy, Shawn C, PA-C  metFORMIN (GLUCOPHAGE) 500 MG tablet Take 500 mg by mouth 2 (two) times daily with a meal.    [provider]  naproxen (NAPROSYN) 375 MG tablet Take 1 tablet (375 mg total) by mouth 2 (two) times daily with a meal. Patient not taking: Reported on 11/06/2016 07/08/16   Forde Dandy, MD  omeprazole (PRILOSEC) 40 MG capsule Take 40 mg by mouth daily. 05/16/16   [provider]  ondansetron (ZOFRAN ODT) 4 MG disintegrating tablet Take 1 tablet (4 mg total) by mouth every 8 (eight) hours as needed for nausea or vomiting. Patient not taking: Reported on 11/06/2016  04/18/15   Nona Dell, PA-C  permethrin (ELIMITE) 5 % cream Apply to the entire body. Rinse off completely after 8-14 hours. Repeat in 1 week. Patient not taking: Reported on 11/06/2016 10/06/16   Joy, Raquel Sarna C, PA-C  polyethylene glycol (MIRALAX / GLYCOLAX) packet Take 17 g by mouth daily as needed for moderate constipation.    [provider]  promethazine (PHENERGAN) 25 MG tablet Take 1 tablet (25 mg total) by mouth every 6 (six) hours as needed for nausea or vomiting. Patient not taking: Reported on 11/06/2016 02/05/14   Pisciotta, Elmyra Ricks, PA-C  SUMAtriptan (IMITREX) 50 MG tablet Take 50 mg by mouth daily as needed for migraine. 06/06/16   [provider]  topiramate (TOPAMAX) 50 MG tablet Take 50 mg by mouth 2 (two) times daily. Reported on 09/14/2015    [provider]  traZODone (DESYREL) 150 MG tablet Take 150 mg by mouth at bedtime. 04/05/16   [provider]    Family History Family History  Problem Relation Age of Onset  . Diabetes Mother   . Alcohol abuse Father   . Liver disease Father   . Anesthesia problems Neg Hx   . Hypotension Neg Hx   . Malignant hyperthermia Neg Hx   . Pseudochol deficiency Neg Hx     Social History Social History  Substance Use Topics  . Smoking status: Never Smoker  . Smokeless tobacco: Never Used  . Alcohol use No     Allergies   Patient has no known allergies.   Review of Systems Review of Systems  Negative except as stated above in HPI.  Physical Exam Updated Vital Signs BP 127/69 (BP Location: Left Arm)   Pulse 74   Temp 98.2 F (36.8 C) (Oral)   Resp 16   LMP 07/29/2012   SpO2 100%   Physical Exam GEN: Well-appearing female lying in bed in NAD. Alert and oriented.  HENT: Moist mucous membranes. No visible lesions. EYES: EOMI. PERRL. Sclera anicteric. RESP: Clear to auscultation bilaterally. No wheezes, rales, or rhonchi. No increased work of breathing. CV: Normal rate and  regular rhythm. No murmurs, gallops, or rubs. No JVD elevation. No LE edema. Tenderness to palpation on right anterior chest wall. ABD: Soft. Non-tender. Non-distended. Normoactive bowel sounds. EXT: No edema. Warm and well perfused. NEURO: Cranial nerves II-XII intact. 5/5 UE and LE strength. Normal sensation in all extremities. No pronator drift.  ED Treatments / Results  Labs (all labs ordered are listed, but only abnormal results are displayed) Labs Reviewed  BASIC METABOLIC PANEL  CBC  I-STAT TROPONIN, ED    EKG  EKG Interpretation  Date/Time:  Tuesday December 04 2016 07:52:00 EDT Ventricular Rate:  64 PR Interval:  136 QRS Duration: 86 QT Interval:  382  QTC Calculation: 394 R Axis:   101 Text Interpretation:  Sinus rhythm with marked sinus arrhythmia Rightward axis Borderline ECG T wave inversion AVL no longer present otherwise no significant change compared to Aug 2018 Confirmed by Sherwood Gambler [phone]) on 12/04/2016 9:05:05 AM       Radiology Dg Chest 2 View  Result Date: 12/04/2016 CLINICAL DATA:  Mid chest pain EXAM: CHEST  2 VIEW COMPARISON:  11/05/2016 FINDINGS: Heart and mediastinal contours are within normal limits. No focal opacities or effusions. No acute bony abnormality. IMPRESSION: No active cardiopulmonary disease. Electronically Signed   By: Rolm Baptise M.D.   On: 12/04/2016 08:24    Procedures Procedures (including critical care time)  Medications Ordered in ED Medications - No data to display   Initial Impression / Assessment and Plan / ED Course  I have reviewed the triage vital signs and the nursing notes.  Pertinent labs & imaging results that were available during my care of the patient were reviewed by me and considered in my medical decision making (see chart for details).  Ms. Mccomb is a 51yo female who presents with chest pain and headache that is a constant 10/10 in severity. Troponin neg x1, EKG with sinus arrhythmia. Pain  reproducible on palpation. She was also just seen in the ED on 8/14 with similar complaints and CT-A was done, which was negative for PE. With chest pain that has been going on since Saturday, troponin likely would be elevated if cardiac etiology and thus trending troponin not likely to be helpful. With normal EKG, reproducible pain, and mild relief with advil, I suspect this is MSK in etiology.  As for her headache, she has a normal neuro exam and denies any focal deficits. She states she does not normally get headaches. The headache sounds mostly tension/migraine. Do not think imaging is indicated. Will treat with reglan, benadryl, toradol and IV fluids and re-assess later to see how she feels.  11:06am CBC and BMP wnl. Patient states her headache and chest pain are better with medications and fluids. Patient advised of return precautions and encouraged to follow up with primary doctor outpatient for further evaluation. Patient stable for discharge.  Patient seen and discussed with Dr. Regenia Skeeter.  Final Clinical Impressions(s) / ED Diagnoses   Final diagnoses:  None    New Prescriptions New Prescriptions   No medications on file     Colbert Ewing, MD 12/04/16 1107    Sherwood Gambler, MD 12/04/16 [phone]

## 2016-12-04 NOTE — ED Triage Notes (Signed)
Patient complains of intermittent chest pain, headache and back pain since Saturday. States that the pain is sharp and worse with inspiration and movement. Alert and oriented, NAD

## 2016-12-04 NOTE — Discharge Instructions (Signed)
Based on the tests we did in the hospital, we do not think your heart is causing the chest pain. The chest pain that you are having is likely musculoskeletal. Your headache may be due to stress or migraines. You can take tylenol 650mg  every 6 hours as needed for pain. You can also use the medicines that your neurology doctor prescribed you for headaches - imitrex and topamax. Please take these as prescribed by your neurologist.  Please schedule an appointment with your primary care doctor in 2 weeks to follow up on your headache and chest pain.

## 2017-11-29 ENCOUNTER — Emergency Department (HOSPITAL_COMMUNITY)
Admission: EM | Admit: 2017-11-29 | Discharge: 2017-11-29 | Disposition: A | Discharge status: Home / Self Care | Payer: Medicare Other | Attending: Emergency Medicine | Admitting: Emergency Medicine

## 2017-11-29 ENCOUNTER — Encounter (HOSPITAL_COMMUNITY): Payer: Self-pay | Admitting: Emergency Medicine

## 2017-11-29 DIAGNOSIS — T25021A Burn of unspecified degree of right foot, initial encounter: Secondary | ICD-10-CM | POA: Diagnosis present

## 2017-11-29 DIAGNOSIS — T25221A Burn of second degree of right foot, initial encounter: Secondary | ICD-10-CM | POA: Insufficient documentation

## 2017-11-29 DIAGNOSIS — Z79899 Other long term (current) drug therapy: Secondary | ICD-10-CM | POA: Diagnosis not present

## 2017-11-29 DIAGNOSIS — Z7984 Long term (current) use of oral hypoglycemic drugs: Secondary | ICD-10-CM | POA: Diagnosis not present

## 2017-11-29 DIAGNOSIS — Y999 Unspecified external cause status: Secondary | ICD-10-CM | POA: Insufficient documentation

## 2017-11-29 DIAGNOSIS — Z23 Encounter for immunization: Secondary | ICD-10-CM | POA: Insufficient documentation

## 2017-11-29 DIAGNOSIS — E119 Type 2 diabetes mellitus without complications: Secondary | ICD-10-CM | POA: Insufficient documentation

## 2017-11-29 DIAGNOSIS — J45909 Unspecified asthma, uncomplicated: Secondary | ICD-10-CM | POA: Insufficient documentation

## 2017-11-29 DIAGNOSIS — Y929 Unspecified place or not applicable: Secondary | ICD-10-CM | POA: Insufficient documentation

## 2017-11-29 DIAGNOSIS — Y939 Activity, unspecified: Secondary | ICD-10-CM | POA: Insufficient documentation

## 2017-11-29 DIAGNOSIS — X102XXA Contact with fats and cooking oils, initial encounter: Secondary | ICD-10-CM | POA: Insufficient documentation

## 2017-11-29 MED ORDER — SILVER SULFADIAZINE 1 % EX CREA
TOPICAL_CREAM | Freq: Once | CUTANEOUS | Status: AC
  Administered 2017-11-29: 1 via TOPICAL
  Filled 2017-11-29: qty 85

## 2017-11-29 MED ORDER — IBUPROFEN 400 MG PO TABS
600.0000 mg | ORAL_TABLET | Freq: Once | ORAL | Status: AC
  Administered 2017-11-29: 600 mg via ORAL
  Filled 2017-11-29: qty 1

## 2017-11-29 MED ORDER — OXYCODONE-ACETAMINOPHEN 5-325 MG PO TABS
1.0000 | ORAL_TABLET | Freq: Once | ORAL | Status: AC
  Administered 2017-11-29: 1 via ORAL
  Filled 2017-11-29: qty 1

## 2017-11-29 MED ORDER — ACETAMINOPHEN 325 MG PO TABS
650.0000 mg | ORAL_TABLET | Freq: Once | ORAL | Status: AC
  Administered 2017-11-29: 650 mg via ORAL
  Filled 2017-11-29: qty 2

## 2017-11-29 MED ORDER — TETANUS-DIPHTH-ACELL PERTUSSIS 5-2.5-18.5 LF-MCG/0.5 IM SUSP
0.5000 mL | Freq: Once | INTRAMUSCULAR | Status: AC
  Administered 2017-11-29: 0.5 mL via INTRAMUSCULAR
  Filled 2017-11-29: qty 0.5

## 2017-11-29 NOTE — ED Notes (Signed)
ED Provider at bedside. 

## 2017-11-29 NOTE — ED Notes (Signed)
D/c instructions reviewed with patient. Pt given supplies for dressing change. Pt denied any further requests.

## 2017-11-29 NOTE — ED Provider Notes (Addendum)
[facility] EMERGENCY DEPARTMENT Provider Note   CSN: [id number] Arrival date & time: 11/29/17  1150     History   Chief Complaint Chief Complaint  Patient presents with  . Burn    HPI Tiffany Blake is a 52 y.o. female with history of diabetes on oral agents, chronic headache, sleep apnea, asthma, is here for evaluation of burn.  Onset last night, patient dropped some hot grease on the top of her right foot .  Associated with swelling, blistering, redness, severe pain.  Worse with any touch.  No interventions for this.  No alleviating factors.  HPI  Past Medical History:  Diagnosis Date  . Asthma   . Depression    on meds  . Diabetes mellitus without complication ([place])   . Drug abuse ([place]) 2002  . Headache(784.0)   . HSV (herpes simplex virus) infection   . Muscle spasms of head or neck   . Ovarian cyst   . Sleep apnea     Patient Active Problem List   Diagnosis Date Noted  . Menorrhagia 05/23/2011    Past Surgical History:  Procedure Laterality Date  . CESAREAN SECTION    . TUBAL LIGATION       OB History    Gravida  5   Para  5   Term  5   Preterm  0   AB  0   Living  5     SAB  0   TAB  0   Ectopic  0   Multiple  0   Live Births  5            Home Medications    Prior to Admission medications   Medication Sig Start Date End Date Taking? Authorizing Provider  acetaminophen (TYLENOL) 325 MG tablet Take 2 tablets (650 mg total) by mouth every 6 (six) hours as needed. 12/04/16   Colbert Ewing, MD  albuterol (PROVENTIL HFA;VENTOLIN HFA) 108 (90 BASE) MCG/ACT inhaler Inhale 2 puffs into the lungs every 6 (six) hours as needed for wheezing or shortness of breath. 04/06/14   Piepenbrink, Anderson Malta, PA-C  chlordiazePOXIDE (LIBRIUM) 25 MG capsule 50mg  PO TID x 1D, then 25-50mg  PO BID X 1D, then 25-50mg  PO QD X 1D Patient not taking: Reported on 11/06/2016 10/05/15   Jola Schmidt, MD  cyclobenzaprine (FLEXERIL) 10 MG tablet  Take 1 tablet (10 mg total) by mouth 2 (two) times daily as needed for muscle spasms. Patient not taking: Reported on 11/06/2016 07/08/16   Forde Dandy, MD  docusate sodium (COLACE) 100 MG capsule Take 1 capsule (100 mg total) by mouth every 12 (twelve) hours. Patient not taking: Reported on 11/06/2016 09/14/15   Frederica Kuster, PA-C  FLUoxetine (PROZAC) 20 MG capsule Take 40 mg by mouth daily.    [provider]  gemfibrozil (LOPID) 600 MG tablet Take 600 mg by mouth 2 (two) times daily. 05/16/16   [provider]  hydrOXYzine (ATARAX/VISTARIL) 25 MG tablet Take 1 tablet (25 mg total) by mouth every 6 (six) hours. 10/06/16   Joy, Shawn C, PA-C  metFORMIN (GLUCOPHAGE) 500 MG tablet Take 500 mg by mouth 2 (two) times daily with a meal.    [provider]  naproxen (NAPROSYN) 375 MG tablet Take 1 tablet (375 mg total) by mouth 2 (two) times daily with a meal. Patient not taking: Reported on 11/06/2016 07/08/16   Forde Dandy, MD  omeprazole (PRILOSEC) 40 MG capsule Take 40 mg  by mouth daily. 05/16/16   [provider]  ondansetron (ZOFRAN ODT) 4 MG disintegrating tablet Take 1 tablet (4 mg total) by mouth every 8 (eight) hours as needed for nausea or vomiting. Patient not taking: Reported on 11/06/2016 04/18/15   Nona Dell, PA-C  permethrin (ELIMITE) 5 % cream Apply to the entire body. Rinse off completely after 8-14 hours. Repeat in 1 week. Patient not taking: Reported on 11/06/2016 10/06/16   Joy, Raquel Sarna C, PA-C  polyethylene glycol (MIRALAX / GLYCOLAX) packet Take 17 g by mouth daily as needed for moderate constipation.    [provider]  promethazine (PHENERGAN) 25 MG tablet Take 1 tablet (25 mg total) by mouth every 6 (six) hours as needed for nausea or vomiting. Patient not taking: Reported on 11/06/2016 02/05/14   Pisciotta, Elmyra Ricks, PA-C  SUMAtriptan (IMITREX) 50 MG tablet Take 50 mg by mouth daily as needed for migraine. 06/06/16   [provider]  topiramate (TOPAMAX) 50 MG tablet Take 50 mg by mouth 2 (two) times daily. Reported on 09/14/2015    [provider]  traZODone (DESYREL) 150 MG tablet Take 150 mg by mouth at bedtime. 04/05/16   [provider]  medroxyPROGESTERone (DEPO-PROVERA) 150 MG/ML injection Inject 150 mg into the muscle once. 04/17/11 06/06/11  [provider]    Family History Family History  Problem Relation Age of Onset  . Diabetes Mother   . Alcohol abuse Father   . Liver disease Father   . Anesthesia problems Neg Hx   . Hypotension Neg Hx   . Malignant hyperthermia Neg Hx   . Pseudochol deficiency Neg Hx     Social History Social History   Tobacco Use  . Smoking status: Never Smoker  . Smokeless tobacco: Never Used  Substance Use Topics  . Alcohol use: No  . Drug use: No    Types: Cocaine    Comment: clean since 2002     Allergies   Patient has no known allergies.   Review of Systems Review of Systems  Skin: Positive for wound.  Allergic/Immunologic: Positive for immunocompromised state (DM).  All other systems reviewed and are negative.    Physical Exam Updated Vital Signs BP (!) 163/110   Pulse 96   Temp 99.2 F (37.3 C) (Oral)   Resp 18   LMP 07/29/2012   SpO2 99%   Physical Exam  Constitutional: She is oriented to person, place, and time. She appears well-developed and well-nourished.  Non-toxic appearance.  HENT:  Head: Normocephalic.  Right Ear: External ear normal.  Left Ear: External ear normal.  Nose: Nose normal.  Eyes: Conjunctivae and EOM are normal.  Neck: Full passive range of motion without pain.  Cardiovascular: Normal rate.  Pulmonary/Chest: Effort normal. No tachypnea. No respiratory distress.  Musculoskeletal: Normal range of motion.  Full ROM of ankles, mild pain at right ankle secondary to nearby wound. Can wiggle toes  Neurological: She is alert and oriented to person, place, and time.  Skin: Skin is warm  and dry. Capillary refill takes less than 2 seconds.  Two full thickness burn wounds to dorsal aspect of right food along base of toes.  Mild surrounding erythema and edema to mid foot. Missing toe nails at bilateral toes, pt states removed after ingrown nails. See picture pre and post debridement.   Psychiatric: Her behavior is normal. Thought content normal.         ED Treatments / Results  Labs (all labs ordered  are listed, but only abnormal results are displayed) Labs Reviewed - No data to display  EKG None  Radiology No results found.  Procedures .Burn Treatment Date/Time: 11/29/2017 2:58 PM Performed by: Kinnie Feil, PA-C Authorized by: Kinnie Feil, PA-C   Consent:    Consent obtained:  Verbal   Consent given by:  Patient   Risks discussed:  Bleeding and pain   Alternatives discussed:  Referral Burn area 1 details:    Burn depth:  Partial thickness (2nd)   Affected area:  Lower extremity   Lower extremity location:  R foot   Debridement performed: yes     Debridement mechanism:  Scalpel, gauze and forceps   Indications for debridement: intact blisters     Wound base:  Pink   Wound treatment:  Antiseptic skin cleanser and silver sulfadiazine   Dressing:  Non-stick sterile dressing and fine mesh gauze Additional burn areas:  2 Burn area 2 details:    Burn depth:  Partial thickness (2nd)   Affected area:  Lower extremity   Lower extremity location:  R foot   Wound treatment:  Silver sulfadiazine and antiseptic skin cleanser   Dressing:  Non-stick sterile dressing and splint Post-procedure details:    Patient tolerance of procedure:  Tolerated well, no immediate complications   (including critical care time)  Medications Ordered in ED Medications  oxyCODONE-acetaminophen (PERCOCET/ROXICET) 5-325 MG per tablet 1 tablet (1 tablet Oral Given 11/29/17 1202)  Tdap (BOOSTRIX) injection 0.5 mL (0.5 mLs Intramuscular Given 11/29/17 1442)  silver  sulfADIAZINE (SILVADENE) 1 % cream (1 application Topical Given 11/29/17 1443)  acetaminophen (TYLENOL) tablet 650 mg (650 mg Oral Given 11/29/17 1444)  ibuprofen (ADVIL,MOTRIN) tablet 600 mg (600 mg Oral Given 11/29/17 1444)     Initial Impression / Assessment and Plan / ED Course  I have reviewed the triage vital signs and the nursing notes.  Pertinent labs & imaging results that were available during my care of the patient were reviewed by me and considered in my medical decision making (see chart for details).     Patient with deep partial-thickness wound of the right foot.  Wound was thoroughly cleansed with wound cleanser and iodine before debridement.  Debridement performed to prevent traumatic rupture of blisters at home, and to ensure rupture of blisters were in controlled environment in sterile technique. Patient tolerated procedure well without immediate complications.  Silvadene cream and nonadherent dressing applied, postop and crutches given for comfort and to prevent further trauma.  Patient is higher risk for wound infection given history of diabetes.  I discussed this with patient and gave her strict return precautions.  Wound care instructions given.  I do not think prophylactic antibiotics indicated however.  She has a podiatrist, recommended she followed up with podiatry in the next 3- 5 days for wound check.  Tdap updated. She has h/o substance abuse so will maximize NSAID and avoid opioids today.   Final Clinical Impressions(s) / ED Diagnoses   Final diagnoses:  Partial thickness burn of right foot, initial encounter    ED Discharge Orders    None         Kinnie Feil, PA-C 11/29/17 1501    Drenda Freeze, MD 11/30/17 [phone]

## 2017-11-29 NOTE — ED Triage Notes (Signed)
Pt presents to ED for assessment of burn to right foot after patient dropped got grease on it last night.  Large blister and redness noted.  Patient tearful in triage.

## 2017-11-29 NOTE — Discharge Instructions (Addendum)
You have a deep partial thickness burn wound to your foot.   Keep this wound clean with clean water and soap, tap dry and apply thin layer of cream given to you today. Change your dressing at least twice daily and whenever dirty. If there is associated itching, you can take benadryl.   Take 1000 mg acetaminophen (tylenol) and/or 600 mg ibuprofen (advil) every 6-8 hours. Elevate your foot. Wear boot and use crutches for comfort and to avoid further trauma.   Follow up with your podiatrist in 3-4 days for wound check.   Return for increased swelling, warmth, pus, drainage, fevers, chills.

## 2018-07-15 ENCOUNTER — Other Ambulatory Visit: Payer: Self-pay | Admitting: Physician Assistant

## 2018-07-15 DIAGNOSIS — Z1231 Encounter for screening mammogram for malignant neoplasm of breast: Secondary | ICD-10-CM

## 2018-09-17 ENCOUNTER — Observation Stay (HOSPITAL_COMMUNITY)
Admission: AD | Admit: 2018-09-17 | Discharge: 2018-09-18 | Disposition: A | Discharge status: Home / Self Care | Payer: Medicare Other | Source: Intra-hospital | Attending: Psychiatry | Admitting: Psychiatry

## 2018-09-17 ENCOUNTER — Other Ambulatory Visit: Payer: Self-pay | Admitting: Behavioral Health

## 2018-09-17 ENCOUNTER — Other Ambulatory Visit: Payer: Self-pay

## 2018-09-17 ENCOUNTER — Encounter (HOSPITAL_COMMUNITY): Payer: Self-pay

## 2018-09-17 ENCOUNTER — Ambulatory Visit: Payer: Self-pay

## 2018-09-17 ENCOUNTER — Emergency Department (HOSPITAL_COMMUNITY)
Admission: EM | Admit: 2018-09-17 | Discharge: 2018-09-17 | Disposition: A | Discharge status: Other Acute Inpatient Hospital | Payer: Medicare Other | Attending: Emergency Medicine | Admitting: Emergency Medicine

## 2018-09-17 ENCOUNTER — Encounter (HOSPITAL_COMMUNITY): Payer: Self-pay | Admitting: *Deleted

## 2018-09-17 DIAGNOSIS — G4730 Sleep apnea, unspecified: Secondary | ICD-10-CM | POA: Insufficient documentation

## 2018-09-17 DIAGNOSIS — R45851 Suicidal ideations: Secondary | ICD-10-CM | POA: Diagnosis not present

## 2018-09-17 DIAGNOSIS — Z79899 Other long term (current) drug therapy: Secondary | ICD-10-CM | POA: Insufficient documentation

## 2018-09-17 DIAGNOSIS — F79 Unspecified intellectual disabilities: Secondary | ICD-10-CM | POA: Insufficient documentation

## 2018-09-17 DIAGNOSIS — E119 Type 2 diabetes mellitus without complications: Secondary | ICD-10-CM | POA: Insufficient documentation

## 2018-09-17 DIAGNOSIS — J45909 Unspecified asthma, uncomplicated: Secondary | ICD-10-CM | POA: Diagnosis not present

## 2018-09-17 DIAGNOSIS — Z7984 Long term (current) use of oral hypoglycemic drugs: Secondary | ICD-10-CM | POA: Diagnosis not present

## 2018-09-17 DIAGNOSIS — F329 Major depressive disorder, single episode, unspecified: Secondary | ICD-10-CM | POA: Diagnosis present

## 2018-09-17 DIAGNOSIS — Z1159 Encounter for screening for other viral diseases: Secondary | ICD-10-CM | POA: Diagnosis not present

## 2018-09-17 DIAGNOSIS — Z833 Family history of diabetes mellitus: Secondary | ICD-10-CM | POA: Diagnosis not present

## 2018-09-17 DIAGNOSIS — Z0489 Encounter for examination and observation for other specified reasons: Secondary | ICD-10-CM | POA: Diagnosis present

## 2018-09-17 LAB — RAPID URINE DRUG SCREEN, HOSP PERFORMED
Amphetamines: NOT DETECTED
Barbiturates: NOT DETECTED
Benzodiazepines: NOT DETECTED
Cocaine: POSITIVE — AB
Opiates: NOT DETECTED
Tetrahydrocannabinol: NOT DETECTED

## 2018-09-17 LAB — COMPREHENSIVE METABOLIC PANEL
ALT: 19 U/L (ref 0–44)
AST: 16 U/L (ref 15–41)
Albumin: 3.9 g/dL (ref 3.5–5.0)
Alkaline Phosphatase: 82 U/L (ref 38–126)
Anion gap: 9 (ref 5–15)
BUN: 21 mg/dL — ABNORMAL HIGH (ref 6–20)
CO2: 26 mmol/L (ref 22–32)
Calcium: 8.9 mg/dL (ref 8.9–10.3)
Chloride: 102 mmol/L (ref 98–111)
Creatinine, Ser: 0.9 mg/dL (ref 0.44–1.00)
GFR calc Af Amer: >60 mL/min (ref >60)
GFR calc non Af Amer: >60 mL/min (ref >60)
Glucose, Bld: 111 mg/dL — ABNORMAL HIGH (ref 70–99)
Potassium: 3.9 mmol/L (ref 3.5–5.1)
Sodium: 137 mmol/L (ref 135–145)
Total Bilirubin: 0.4 mg/dL (ref 0.3–1.2)
Total Protein: 7.7 g/dL (ref 6.5–8.1)

## 2018-09-17 LAB — PREGNANCY, URINE: Preg Test, Ur: NEGATIVE

## 2018-09-17 LAB — ACETAMINOPHEN LEVEL: Acetaminophen (Tylenol), Serum: <10 ug/mL — ABNORMAL LOW (ref 10–30)

## 2018-09-17 LAB — CBC
HCT: 43.5 % (ref 36.0–46.0)
Hemoglobin: 13.4 g/dL (ref 12.0–15.0)
MCH: 29.1 pg (ref 26.0–34.0)
MCHC: 30.8 g/dL (ref 30.0–36.0)
MCV: 94.4 fL (ref 80.0–100.0)
Platelets: 276 10*3/uL (ref 150–400)
RBC: 4.61 MIL/uL (ref 3.87–5.11)
RDW: 13.2 % (ref 11.5–15.5)
WBC: 9.1 10*3/uL (ref 4.0–10.5)
nRBC: 0 % (ref 0.0–0.2)

## 2018-09-17 LAB — ETHANOL: Alcohol, Ethyl (B): <10 mg/dL (ref <10)

## 2018-09-17 LAB — SARS CORONAVIRUS 2 BY RT PCR (HOSPITAL ORDER, PERFORMED IN ~~LOC~~ HOSPITAL LAB): SARS Coronavirus 2: NEGATIVE

## 2018-09-17 LAB — SALICYLATE LEVEL: Salicylate Lvl: <7 mg/dL (ref 2.8–30.0)

## 2018-09-17 MED ORDER — LORAZEPAM 2 MG/ML IJ SOLN: 0.0000 mg | Freq: Four times a day (QID) | INTRAMUSCULAR | Status: DC

## 2018-09-17 MED ORDER — FLUOXETINE HCL 20 MG PO CAPS: 40.0000 mg | ORAL_CAPSULE | Freq: Every day | ORAL | Status: DC

## 2018-09-17 MED ORDER — TOPIRAMATE 25 MG PO TABS: 50.0000 mg | ORAL_TABLET | Freq: Two times a day (BID) | ORAL | Status: DC

## 2018-09-17 MED ORDER — LORAZEPAM 2 MG/ML IJ SOLN: 0.0000 mg | Freq: Two times a day (BID) | INTRAMUSCULAR | Status: DC

## 2018-09-17 MED ORDER — NICOTINE 21 MG/24HR TD PT24: 21.0000 mg | MEDICATED_PATCH | Freq: Once | TRANSDERMAL | Status: DC

## 2018-09-17 MED ORDER — TRAZODONE HCL 50 MG PO TABS: 150.0000 mg | ORAL_TABLET | Freq: Every day | ORAL | Status: DC

## 2018-09-17 MED ORDER — ALBUTEROL SULFATE HFA 108 (90 BASE) MCG/ACT IN AERS: 1.0000 | INHALATION_SPRAY | RESPIRATORY_TRACT | Status: DC | PRN

## 2018-09-17 MED ORDER — THIAMINE HCL 100 MG/ML IJ SOLN: 100.0000 mg | Freq: Every day | INTRAMUSCULAR | Status: DC

## 2018-09-17 MED ORDER — GEMFIBROZIL 600 MG PO TABS
600.0000 mg | ORAL_TABLET | Freq: Two times a day (BID) | ORAL | Status: DC
  Filled 2018-09-17: qty 1

## 2018-09-17 MED ORDER — PANTOPRAZOLE SODIUM 40 MG PO TBEC: 40.0000 mg | DELAYED_RELEASE_TABLET | Freq: Every day | ORAL | Status: DC

## 2018-09-17 MED ORDER — METFORMIN HCL 500 MG PO TABS: 500.0000 mg | ORAL_TABLET | Freq: Two times a day (BID) | ORAL | Status: DC

## 2018-09-17 MED ORDER — LORAZEPAM 1 MG PO TABS: 0.0000 mg | ORAL_TABLET | Freq: Four times a day (QID) | ORAL | Status: DC

## 2018-09-17 MED ORDER — ALBUTEROL SULFATE HFA 108 (90 BASE) MCG/ACT IN AERS: 2.0000 | INHALATION_SPRAY | Freq: Four times a day (QID) | RESPIRATORY_TRACT | Status: DC | PRN

## 2018-09-17 MED ORDER — VITAMIN B-1 100 MG PO TABS: 100.0000 mg | ORAL_TABLET | Freq: Every day | ORAL | Status: DC

## 2018-09-17 MED ORDER — ALUM & MAG HYDROXIDE-SIMETH 200-200-20 MG/5ML PO SUSP: 30.0000 mL | ORAL | Status: DC | PRN

## 2018-09-17 MED ORDER — HYDROXYZINE HCL 25 MG PO TABS: 25.0000 mg | ORAL_TABLET | Freq: Four times a day (QID) | ORAL | Status: DC

## 2018-09-17 MED ORDER — ACETAMINOPHEN 325 MG PO TABS: 650.0000 mg | ORAL_TABLET | Freq: Four times a day (QID) | ORAL | Status: DC | PRN

## 2018-09-17 MED ORDER — LORAZEPAM 1 MG PO TABS: 0.0000 mg | ORAL_TABLET | Freq: Two times a day (BID) | ORAL | Status: DC

## 2018-09-17 NOTE — Progress Notes (Signed)
[facility] NOVEL CORONAVIRUS (COVID-19) DAILY CHECK-OFF SYMPTOMS - answer yes or no to each - every day NO YES  Have you had a fever in the past 24 hours?  . Fever (Temp > 37.80C / 100F) X   Have you had any of these symptoms in the past 24 hours? . New Cough .  Sore Throat  .  Shortness of Breath .  Difficulty Breathing .  Unexplained Body Aches   X   Have you had any one of these symptoms in the past 24 hours not related to allergies?   . Runny Nose .  Nasal Congestion .  Sneezing   X   If you have had runny nose, nasal congestion, sneezing in the past 24 hours, has it worsened?  X   EXPOSURES - check yes or no X   Have you traveled outside the state in the past 14 days?  X   Have you been in contact with someone with a confirmed diagnosis of COVID-19 or PUI in the past 14 days without wearing appropriate PPE?  X   Have you been living in the same home as a person with confirmed diagnosis of COVID-19 or a PUI (household contact)?    X   Have you been diagnosed with COVID-19?    X              What to do next: Answered NO to all: Answered YES to anything:   Proceed with unit schedule Follow the [facility] Inpatient Flowsheet.   

## 2018-09-17 NOTE — Progress Notes (Signed)
States that depression has caused lack of appetite.  No weight loss reported.

## 2018-09-17 NOTE — Progress Notes (Signed)
Tiffany Blake is a 53 year female admitted to Observation after telling Psychiatrist that she wanted to jump off a bridge.  Upon entering Obs Unit, she shared that she was scared to be here.  Pt noted to be giggling intermittently, she states that she laughs when she wants to cry.  "I wanted to get hit by a car today."  Pt states that she misses her son that is in prison, "He killed my grandbaby by shaking it when it wouldn't stop crying."  She elaborated that this son went to prison in 1999 at age 28. Pt also states that she has been calling her family and no one responds, "My family is not visiting, I really wanted to get hit by a car."  Asked pt if she has considered any other ways of suicide, she responded: "Yes, I thought about digging a hole and getting in it."  She shared that she "always feels like dying," but doesn't want to tell her Psychiatrist this information.  Pt denies AVH and is currently able to contract for safety.  She states that she drank "a 40 of Natural Ice and 12oz of Colt 45 last night.  "I am gonna be honest, I hit crack last night too."  She is wearing a mask and at times is difficulty to understand but is cooperative. Shared that she has a roommate/friend that has made her stressed, "I buy the food and she just eats it."  Noted that pt was sobbing upon entering her room but brightens quickly and tries to funny/silly.  Admission assessment and search completed,  Belongings listed and secured.  Treatment plan explained and pt. oriented to unit.

## 2018-09-17 NOTE — Progress Notes (Signed)
Tiffany Blake observe resting in room with eyes open. She denies SI/HI/AVH at this time. She presents with depressed/anxious affect and mood.She was requesting PRN for sleep. Will notify provider on call. Snacks and fluids offered; both accepted. Support offered. Will continue with POC.

## 2018-09-17 NOTE — ED Triage Notes (Signed)
Per EMS- Patient went for a psych evaluation today and the physician called 911 and stated that the patient needed to be evaluated before they see her. Physician reported to EMS that the patient was suicidal and her plan was to jump off of a bridge. Patient told EMS that she had many plans.

## 2018-09-17 NOTE — Plan of Care (Signed)
[facility] Plan  Reason for Crisis Plan:  Crisis Stabilization   Plan of Care:  Referral for Telepsychiatry/Psychiatric Consult  Family Support:    Pt reports that family is not in touch. Current Living Environment:  Living Arrangements: Non-relatives/Friends  Insurance:   Hospital Account    Name Acct ID Class Status Primary Coverage   [facility] [place] [id number] BEHAVIORAL HEALTH OBSERVATION Open MEDICARE - MEDICARE PART A AND B        Guarantor Account (for Hospital Account [id number])    Name Relation to Pt Service Area Active? Acct Type   Cleora Fleet Self [facility] Yes Behavioral Health   Address Phone       [place], [place] [address] [phone])          Coverage Information (for Hospital Account [id number])    1. MEDICARE/MEDICARE PART A AND B    F/O Payor/Plan Precert #   MEDICARE/MEDICARE PART A AND B    Subscriber Subscriber #   Saphira, Lahmann [id number]   Address Phone   [address] [place],  [address]        2. [place]    F/O Payor/Plan Precert #   [facility] MEDICAID/[place]    Subscriber Subscriber #   Lyllian, Gause [id number]   Address Phone   [address] [place], [place] [address] [phone]          Legal Guardian:   Unknown at current time  Primary Care Provider:  System, Pcp Not In  Current Outpatient Providers:  Pt cannot remember.  Psychiatrist:  Name of Psychiatrist: unknown- new to her  Counselor/Therapist:  Name of Therapist: none  Compliant with Medications:  No  Additional Information:   Suezanne Cheshire Leafy Kindle 6/24/20207:22 PM
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
## 2018-09-17 NOTE — ED Provider Notes (Signed)
Emergency Department Provider Note   I have reviewed the triage vital signs and the nursing notes.   HISTORY  Chief Complaint Suicidal   HPI Tiffany Blake is a 53 y.o. female with PMH of asthma, DM, OSA, and depression not currently on psychiatric medication presents to the emergency department from her psychiatrist office.  She was seeing a new psychiatrist today who she had called to make an appointment due to worsening depression symptoms.  She reports both suicidal and homicidal ideation.  She had developed a plan to jump off a bridge.  She went to her office appointment and told the psychiatrist this and was transferred to the emergency department by EMS for emergency psychiatry evaluation.  Patient states that she has a history of suicide attempt of taking pills many years ago.  She has not been on any psychiatry medications.  She has been compliant with her other medications.  She does not drink alcohol daily but denies any known history of complicated alcohol withdrawal.  She denies using drugs.  She does use "dip" tobacco.   Past Medical History:  Diagnosis Date  . Asthma   . Depression    on meds  . Diabetes mellitus without complication ([place])   . Drug abuse ([place]) 2002  . Headache(784.0)   . HSV (herpes simplex virus) infection   . Muscle spasms of head or neck   . Ovarian cyst   . Sleep apnea     Patient Active Problem List   Diagnosis Date Noted  . MDD (major depressive disorder) 09/17/2018  . Menorrhagia 05/23/2011    Past Surgical History:  Procedure Laterality Date  . CESAREAN SECTION    . TUBAL LIGATION      Allergies Patient has no known allergies.  Family History  Problem Relation Age of Onset  . Diabetes Mother   . Alcohol abuse Father   . Liver disease Father   . Anesthesia problems Neg Hx   . Hypotension Neg Hx   . Malignant hyperthermia Neg Hx   . Pseudochol deficiency Neg Hx     Social History Social History   Tobacco Use  .  Smoking status: Never Smoker  . Smokeless tobacco: Current User    Types: Snuff  Substance Use Topics  . Alcohol use: Yes    Comment: 40-80 Natural Ice and 12 oz Colt 45 daily  . Drug use: Yes    Types: "Crack" cocaine    Review of Systems  Constitutional: No fever/chills Eyes: No visual changes. ENT: No sore throat. Cardiovascular: Denies chest pain. Respiratory: Denies shortness of breath. Gastrointestinal: No abdominal pain.  No nausea, no vomiting.  No diarrhea.  No constipation. Genitourinary: Negative for dysuria. Musculoskeletal: Negative for back pain. Skin: Negative for rash. Neurological: Negative for headaches, focal weakness or numbness. Psychiatric:  Suicidal and homicidal ideation.  10-point ROS otherwise negative.  ____________________________________________   PHYSICAL EXAM:  VITAL SIGNS: ED Triage Vitals  Enc Vitals Group     BP 09/17/18 1219 (!) 157/103     Pulse Rate 09/17/18 1219 82     Resp 09/17/18 1219 16     Temp 09/17/18 1219 98.2 F (36.8 C)     Temp Source 09/17/18 1219 Oral     SpO2 09/17/18 1214 98 %     Weight 09/17/18 1222 225 lb (102.1 kg)     Height 09/17/18 1222 5\' 2"  (1.575 m)   Constitutional: Alert and oriented. Well appearing and in no acute distress. Eyes: Conjunctivae  are normal.  Head: Atraumatic. Nose: No congestion/rhinnorhea. Mouth/Throat: Mucous membranes are moist.   Neck: No stridor. Cardiovascular: Normal rate, regular rhythm. Good peripheral circulation. Grossly normal heart sounds.   Respiratory: Normal respiratory effort.  No retractions. Lungs CTAB. Gastrointestinal: Soft and nontender. No distention.  Musculoskeletal: No lower extremity tenderness nor edema. No gross deformities of extremities. Neurologic:  Normal speech and language. No gross focal neurologic deficits are appreciated.  Skin:  Skin is warm, dry and intact. No rash noted. Psychiatric: Mood and affect are normal. Speech and behavior are normal.   ____________________________________________   LABS (all labs ordered are listed, but only abnormal results are displayed)  Labs Reviewed  COMPREHENSIVE METABOLIC PANEL - Abnormal; Notable for the following components:      Result Value   Glucose, Bld 111 (*)    BUN 21 (*)    All other components within normal limits  ACETAMINOPHEN LEVEL - Abnormal; Notable for the following components:   Acetaminophen (Tylenol), Serum <10 (*)    All other components within normal limits  RAPID URINE DRUG SCREEN, HOSP PERFORMED - Abnormal; Notable for the following components:   Cocaine POSITIVE (*)    All other components within normal limits  ETHANOL  SALICYLATE LEVEL  CBC  PREGNANCY, URINE   ______________________________________  RADIOLOGY  None ____________________________________________   PROCEDURES  Procedure(s) performed:   Procedures  None ____________________________________________   INITIAL IMPRESSION / ASSESSMENT AND PLAN / ED COURSE  Pertinent labs & imaging results that were available during my care of the patient were reviewed by me and considered in my medical decision making (see chart for details).   Patient presents to the emergency department from her psychiatrist office for emergency psychiatry evaluation.  She endorses both suicidal and homicidal ideation with a plan to jump off a bridge.  She is not currently on psychiatric medication.  Will provide screening lab work, PRN medications, restart home meds, and have TTS evaluate. Will place on CIWA protocol with drinking history.   Labs pending. Care transferred to Dr. Eulis Foster.  ____________________________________________  FINAL CLINICAL IMPRESSION(S) / ED DIAGNOSES  Final diagnoses:  Suicidal ideation    Note:  This document was prepared using Dragon voice recognition software and may include unintentional dictation errors.  Nanda Quinton, MD Emergency Medicine    Rodney Yera, [facility], MD 09/17/18 [phone]

## 2018-09-17 NOTE — ED Provider Notes (Addendum)
4:30 PM-I was asked to evaluate labs, prior to TTS evaluation.  Labs have returned and show only significant abnormality being presence of cocaine.  TTS has been consulted.  Clinical Course as of Sep 16 1632  Wed Sep 17, 2018  1630 Normal  Acetaminophen level(!) [EW]  1630 Normal  Ethanol [EW]  1630 Normal  cbc [EW]  6333 Normal  Salicylate level [EW]  1630 Normal except glucose high, BUN high  Comprehensive metabolic panel(!) [EW]  5456 Abnormal, cocaine present  Rapid urine drug screen (hospital performed)(!) [EW]    Clinical Course User Index [EW] Daleen Bo, MD      Medical decision making-at this point the patient is medically cleared for treatment by psychiatry, inpatient or outpatient.  She has minimal elevation of glucose without other signs of metabolic instability.  She may be slightly dehydrated however this should correct with usual nutrition.  There is no indication for hospitalization, or additional ED intervention at this time    Daleen Bo, MD 09/17/18 [phone]

## 2018-09-17 NOTE — ED Notes (Signed)
Bed: [facility]3 Expected date:  Expected time:  Means of arrival:  Comments: 

## 2018-09-18 DIAGNOSIS — F79 Unspecified intellectual disabilities: Secondary | ICD-10-CM

## 2018-09-18 DIAGNOSIS — F329 Major depressive disorder, single episode, unspecified: Secondary | ICD-10-CM

## 2018-09-18 LAB — HEMOGLOBIN A1C
Hgb A1c MFr Bld: 5.8 % — ABNORMAL HIGH (ref 4.8–5.6)
Mean Plasma Glucose: 119.76 mg/dL

## 2018-09-18 LAB — LIPID PANEL
Cholesterol: 204 mg/dL — ABNORMAL HIGH (ref 0–200)
HDL: 52 mg/dL (ref >40)
LDL Cholesterol: 121 mg/dL — ABNORMAL HIGH (ref 0–99)
Total CHOL/HDL Ratio: 3.9 RATIO
Triglycerides: 153 mg/dL — ABNORMAL HIGH (ref <150)
VLDL: 31 mg/dL (ref 0–40)

## 2018-09-18 LAB — TSH: TSH: 1.524 u[IU]/mL (ref 0.350–4.500)

## 2018-09-18 MED ORDER — SERTRALINE HCL 25 MG PO TABS
25.0000 mg | ORAL_TABLET | Freq: Every day | ORAL | Status: DC
  Administered 2018-09-18: 25 mg via ORAL
  Filled 2018-09-18: qty 1

## 2018-09-18 MED ORDER — SERTRALINE HCL 25 MG PO TABS: 25.0000 mg | ORAL_TABLET | Freq: Every day | ORAL | 0 refills | Status: DC

## 2018-09-18 NOTE — Consult Note (Signed)
[facility] Psych ED Discharge  09/18/2018 9:20 AM Tiffany Blake  MRN:  [id number] Principal Problem: <principal problem not specified> Discharge Diagnoses: Active Problems:   MDD (major depressive disorder)   Subjective: patient is seen and examined. Patient is a 53 YO female with a PPHx significant for intellectual disability and remote history of depression who had seen a new psychiatrist yesterday and mentioned a long standing history of suicidal thoughts and sent the patient for emergent evaluation. The patient stated that she had suicidal thoughts because of her son being in prison. She stated that her son had been in prison since the 1990's. She denied acute suicidal thoughts. She stated that she had never been admitted to a psychiatric facility in the past. She stated she hade not hurt herself in the past. It was decided to start the patient on sertraline 25 mg po q day and discharge her back to her living facility for out patient care. She was neither acutely suicidal, homicidal, psychotic or required acute psychiatric hospitalization.  Total Time spent with patient: 30 minutes  Past Psychiatric History: denied previous psychiatric hospitalizations. She had been treated with antidepressants in the past but was unable to recal what she had taken.   Past Medical History:  Past Medical History:  Diagnosis Date  . Asthma   . Depression    on meds  . Diabetes mellitus without complication ([place])   . Drug abuse ([place]) 2002  . Headache(784.0)   . HSV (herpes simplex virus) infection   . Muscle spasms of head or neck   . Ovarian cyst   . Sleep apnea     Past Surgical History:  Procedure Laterality Date  . CESAREAN SECTION    . TUBAL LIGATION     Family History:  Family History  Problem Relation Age of Onset  . Diabetes Mother   . Alcohol abuse Father   . Liver disease Father   . Anesthesia problems Neg Hx   . Hypotension Neg Hx   . Malignant hyperthermia Neg Hx   . Pseudochol  deficiency Neg Hx    Family Psychiatric  History: non- contributory Social History:  Social History   Substance and Sexual Activity  Alcohol Use Yes   Comment: 40-80 Natural Ice and 12 oz Colt 45 daily     Social History   Substance and Sexual Activity  Drug Use Yes  . Types: "Crack" cocaine    Social History   Socioeconomic History  . Marital status: Single    Spouse name: Not on file  . Number of children: Not on file  . Years of education: Not on file  . Highest education level: Not on file  Occupational History  . Not on file  Social Needs  . Financial resource strain: Not on file  . Food insecurity    Worry: Not on file    Inability: Not on file  . Transportation needs    Medical: Not on file    Non-medical: Not on file  Tobacco Use  . Smoking status: Never Smoker  . Smokeless tobacco: Current User    Types: Snuff  Substance and Sexual Activity  . Alcohol use: Yes    Comment: 40-80 Natural Ice and 12 oz Colt 45 daily  . Drug use: Yes    Types: "Crack" cocaine  . Sexual activity: Yes    Birth control/protection: Surgical    Comment: month  Lifestyle  . Physical activity    Days per week: Not on file  Minutes per session: Not on file  . Stress: Not on file  Relationships  . Social Herbalist on phone: Not on file    Gets together: Not on file    Attends religious service: Not on file    Active member of club or organization: Not on file    Attends meetings of clubs or organizations: Not on file    Relationship status: Not on file  Other Topics Concern  . Not on file  Social History Narrative  . Not on file    Has this patient used any form of tobacco in the last 30 days? (Cigarettes, Smokeless Tobacco, Cigars, and/or Pipes) Prescription not provided because: does not smoke  Current Medications: Current Facility-Administered Medications  Medication Dose Route Frequency Provider Last Rate Last Dose  . acetaminophen (TYLENOL) tablet  650 mg  650 mg Oral Q6H PRN Mordecai Maes, NP      . albuterol (VENTOLIN HFA) 108 (90 Base) MCG/ACT inhaler 1-2 puff  1-2 puff Inhalation Q4H PRN Lindon Romp A, NP      . alum & mag hydroxide-simeth (MAALOX/MYLANTA) 200-200-20 MG/5ML suspension 30 mL  30 mL Oral Q4H PRN Mordecai Maes, NP      . sertraline (ZOLOFT) tablet 25 mg  25 mg Oral Daily Sharma Covert, MD      . traZODone (DESYREL) tablet 150 mg  150 mg Oral QHS Lindon Romp A, NP       PTA Medications: Medications Prior to Admission  Medication Sig Dispense Refill Last Dose  . acetaminophen (TYLENOL) 325 MG tablet Take 2 tablets (650 mg total) by mouth every 6 (six) hours as needed. 60 tablet 0 Past Month at Unknown time  . albuterol (PROVENTIL HFA;VENTOLIN HFA) 108 (90 BASE) MCG/ACT inhaler Inhale 2 puffs into the lungs every 6 (six) hours as needed for wheezing or shortness of breath. (Patient not taking: Reported on 09/18/2018) 1 Inhaler 2 Not Taking at Unknown time  . chlordiazePOXIDE (LIBRIUM) 25 MG capsule 50mg  PO TID x 1D, then 25-50mg  PO BID X 1D, then 25-50mg  PO QD X 1D (Patient not taking: Reported on 11/06/2016) 10 capsule 0 Not Taking at Unknown time  . cyclobenzaprine (FLEXERIL) 10 MG tablet Take 1 tablet (10 mg total) by mouth 2 (two) times daily as needed for muscle spasms. (Patient not taking: Reported on 11/06/2016) 20 tablet 0 Not Taking at Unknown time  . docusate sodium (COLACE) 100 MG capsule Take 1 capsule (100 mg total) by mouth every 12 (twelve) hours. (Patient not taking: Reported on 11/06/2016) 60 capsule 0 Not Taking at Unknown time  . FLUoxetine (PROZAC) 20 MG capsule Take 40 mg by mouth daily.   Not Taking at Unknown time  . gemfibrozil (LOPID) 600 MG tablet Take 600 mg by mouth 2 (two) times daily.   Not Taking at Unknown time  . hydrOXYzine (ATARAX/VISTARIL) 25 MG tablet Take 1 tablet (25 mg total) by mouth every 6 (six) hours. (Patient not taking: Reported on 09/18/2018) 12 tablet 0 Not Taking at Unknown  time  . metFORMIN (GLUCOPHAGE) 500 MG tablet Take 500 mg by mouth 2 (two) times daily with a meal.   Not Taking at Unknown time  . naproxen (NAPROSYN) 375 MG tablet Take 1 tablet (375 mg total) by mouth 2 (two) times daily with a meal. (Patient not taking: Reported on 11/06/2016) 20 tablet 0 Not Taking at Unknown time  . omeprazole (PRILOSEC) 40 MG capsule Take 40 mg by mouth daily.  Not Taking at Unknown time  . ondansetron (ZOFRAN ODT) 4 MG disintegrating tablet Take 1 tablet (4 mg total) by mouth every 8 (eight) hours as needed for nausea or vomiting. (Patient not taking: Reported on 11/06/2016) 10 tablet 0 Not Taking at Unknown time  . permethrin (ELIMITE) 5 % cream Apply to the entire body. Rinse off completely after 8-14 hours. Repeat in 1 week. (Patient not taking: Reported on 11/06/2016) 60 g 1 Not Taking at Unknown time  . polyethylene glycol (MIRALAX / GLYCOLAX) packet Take 17 g by mouth daily as needed for moderate constipation.   Not Taking at Unknown time  . promethazine (PHENERGAN) 25 MG tablet Take 1 tablet (25 mg total) by mouth every 6 (six) hours as needed for nausea or vomiting. (Patient not taking: Reported on 11/06/2016) 12 tablet 0 Not Taking at Unknown time  . SUMAtriptan (IMITREX) 50 MG tablet Take 50 mg by mouth daily as needed for migraine.   Not Taking at Unknown time  . topiramate (TOPAMAX) 50 MG tablet Take 50 mg by mouth 2 (two) times daily. Reported on 09/14/2015   Not Taking at Unknown time  . traZODone (DESYREL) 150 MG tablet Take 150 mg by mouth at bedtime.   Not Taking at Unknown time    Musculoskeletal: Strength & Muscle Tone: within normal limits Gait & Station: normal Patient leans: N/A  Psychiatric Specialty Exam: Physical Exam  ROS  Blood pressure (!) 155/90, pulse 90, temperature 98.5 F (36.9 C), temperature source Oral, resp. rate 18, last menstrual period 07/29/2012, SpO2 97 %.There is no height or weight on file to calculate BMI.  General Appearance:  Casual  Eye Contact:  Fair  Speech:  Normal Rate  Volume:  Decreased  Mood:  Euthymic  Affect:  Congruent  Thought Process:  Coherent and Descriptions of Associations: Intact  Orientation:  Negative  Thought Content:  Logical  Suicidal Thoughts:  No  Homicidal Thoughts:  No  Memory:  Immediate;   Fair Recent;   Fair Remote;   Fair  Judgement:  Intact  Insight:  Fair  Psychomotor Activity:  Normal  Concentration:  Concentration: Fair and Attention Span: Fair  Recall:  [employer] of Knowledge:  Poor  Language:  Fair  Akathisia:  Negative  Handed:  Right  AIMS (if indicated):     Assets:  Desire for Improvement Resilience  ADL's:  Intact  Cognition:  Impaired,  Mild  Sleep:        Demographic Factors:  Low socioeconomic status and Unemployed  Loss Factors: NA  Historical Factors: Impulsivity  Risk Reduction Factors:   Positive social support  Continued Clinical Symptoms:  Depression:   Impulsivity More than one psychiatric diagnosis  Cognitive Features That Contribute To Risk:  None    Suicide Risk:  Minimal: No identifiable suicidal ideation.  Patients presenting with no risk factors but with morbid ruminations; may be classified as minimal risk based on the severity of the depressive symptoms    Plan Of Care/Follow-up recommendations:  Activity:  ad lib  Disposition: Home Sharma Covert, MD 09/18/2018, 9:20 AM

## 2018-09-18 NOTE — BH Assessment (Signed)
[facility] Assessment Progress Note  Per Myles Lipps, MD, this pt does not require psychiatric hospitalization at this time.  Pt is to be discharged from the [facility] Unit with recommendation to continue treatment with her current unnamed outpatient provider.  This has been included in pt's discharge instructions.  Pt's nurse, Edd Arbour, has been notified.  Jalene Mullet, [place] Triage Specialist [phone]

## 2018-09-18 NOTE — Discharge Instructions (Signed)
For your behavioral health needs, you are advised to continue treatment with your current outpatient provider. °

## 2020-03-13 ENCOUNTER — Encounter (HOSPITAL_COMMUNITY): Payer: Self-pay | Admitting: Emergency Medicine

## 2020-03-13 ENCOUNTER — Emergency Department (HOSPITAL_COMMUNITY)
Admission: EM | Admit: 2020-03-13 | Discharge: 2020-03-13 | Disposition: A | Discharge status: Home / Self Care | Payer: Medicare Other | Attending: Emergency Medicine | Admitting: Emergency Medicine

## 2020-03-13 ENCOUNTER — Other Ambulatory Visit: Payer: Self-pay

## 2020-03-13 DIAGNOSIS — F22 Delusional disorders: Secondary | ICD-10-CM | POA: Diagnosis not present

## 2020-03-13 DIAGNOSIS — Z7984 Long term (current) use of oral hypoglycemic drugs: Secondary | ICD-10-CM | POA: Insufficient documentation

## 2020-03-13 DIAGNOSIS — J45909 Unspecified asthma, uncomplicated: Secondary | ICD-10-CM | POA: Insufficient documentation

## 2020-03-13 DIAGNOSIS — F1410 Cocaine abuse, uncomplicated: Secondary | ICD-10-CM | POA: Diagnosis present

## 2020-03-13 DIAGNOSIS — F1910 Other psychoactive substance abuse, uncomplicated: Secondary | ICD-10-CM

## 2020-03-13 DIAGNOSIS — E119 Type 2 diabetes mellitus without complications: Secondary | ICD-10-CM | POA: Diagnosis not present

## 2020-03-13 LAB — BASIC METABOLIC PANEL
Anion gap: 6 (ref 5–15)
BUN: 23 mg/dL — ABNORMAL HIGH (ref 6–20)
CO2: 28 mmol/L (ref 22–32)
Calcium: 8.5 mg/dL — ABNORMAL LOW (ref 8.9–10.3)
Chloride: 106 mmol/L (ref 98–111)
Creatinine, Ser: 1.06 mg/dL — ABNORMAL HIGH (ref 0.44–1.00)
GFR, Estimated: >60 mL/min (ref >60)
Glucose, Bld: 111 mg/dL — ABNORMAL HIGH (ref 70–99)
Potassium: 4 mmol/L (ref 3.5–5.1)
Sodium: 140 mmol/L (ref 135–145)

## 2020-03-13 LAB — CK: Total CK: 203 U/L (ref 38–234)

## 2020-03-13 MED ORDER — LORAZEPAM 1 MG PO TABS
2.0000 mg | ORAL_TABLET | Freq: Once | ORAL | Status: AC
  Administered 2020-03-13: 2 mg via ORAL
  Filled 2020-03-13: qty 2

## 2020-03-13 NOTE — ED Notes (Signed)
Patent's ride here, walked with patient to out to car, gait steady.

## 2020-03-13 NOTE — ED Triage Notes (Signed)
Patient presents after smoking an unknown substance with an acquaintance. Hx crack cocaine use. Patient states now anxious and has dry mouth

## 2020-03-13 NOTE — ED Notes (Signed)
Patient attempted to call ride again, went to voicemail

## 2020-03-13 NOTE — ED Notes (Signed)
Patient awake and calling ride home

## 2020-03-13 NOTE — ED Notes (Signed)
Patient ambulated to bathroom, gait steady.  Spoke to person who is coming to get her, patient back in room.  Patient more awake now.

## 2020-03-13 NOTE — ED Provider Notes (Signed)
[facility] DEPT Provider Note   CSN: [id number] Arrival date & time: 03/13/20  0141     History Chief Complaint  Patient presents with  . Ingestion    Mekenzie Modeste is a 54 y.o. female.   Ingestion This is a new problem. The current episode started 3 to 5 hours ago. The problem occurs constantly. The problem has not changed since onset.Pertinent negatives include no chest pain, no abdominal pain, no headaches and no shortness of breath. Associated symptoms comments: Dry mouth, thirsty. Nothing aggravates the symptoms. Nothing relieves the symptoms. She has tried nothing for the symptoms. The treatment provided no relief.       Past Medical History:  Diagnosis Date  . Asthma   . Depression    on meds  . Diabetes mellitus without complication ([place])   . Drug abuse ([place]) 2002  . Headache(784.0)   . HSV (herpes simplex virus) infection   . Muscle spasms of head or neck   . Ovarian cyst   . Sleep apnea     Patient Active Problem List   Diagnosis Date Noted  . MDD (major depressive disorder) 09/17/2018  . Menorrhagia 05/23/2011    Past Surgical History:  Procedure Laterality Date  . CESAREAN SECTION    . TUBAL LIGATION       OB History    Gravida  5   Para  5   Term  5   Preterm  0   AB  0   Living  5     SAB  0   IAB  0   Ectopic  0   Multiple  0   Live Births  5           Family History  Problem Relation Age of Onset  . Diabetes Mother   . Alcohol abuse Father   . Liver disease Father   . Anesthesia problems Neg Hx   . Hypotension Neg Hx   . Malignant hyperthermia Neg Hx   . Pseudochol deficiency Neg Hx     Social History   Tobacco Use  . Smoking status: Never Smoker  . Smokeless tobacco: Current User    Types: Snuff  Vaping Use  . Vaping Use: Never used  Substance Use Topics  . Alcohol use: Yes    Comment: 40-80 Natural Ice and 12 oz Colt 45 daily  . Drug use: Yes    Types: "Crack"  cocaine, Cocaine    Home Medications Prior to Admission medications   Medication Sig Start Date End Date Taking? Authorizing Provider  acetaminophen (TYLENOL) 325 MG tablet Take 2 tablets (650 mg total) by mouth every 6 (six) hours as needed. 12/04/16   Colbert Ewing, MD  albuterol (PROVENTIL HFA;VENTOLIN HFA) 108 (90 BASE) MCG/ACT inhaler Inhale 2 puffs into the lungs every 6 (six) hours as needed for wheezing or shortness of breath. Patient not taking: Reported on 09/18/2018 04/06/14   Piepenbrink, Anderson Malta, PA-C  docusate sodium (COLACE) 100 MG capsule Take 1 capsule (100 mg total) by mouth every 12 (twelve) hours. Patient not taking: Reported on 11/06/2016 09/14/15   Frederica Kuster, PA-C  gemfibrozil (LOPID) 600 MG tablet Take 600 mg by mouth 2 (two) times daily. 05/16/16   [provider]  hydrOXYzine (ATARAX/VISTARIL) 25 MG tablet Take 1 tablet (25 mg total) by mouth every 6 (six) hours. Patient not taking: Reported on 09/18/2018 10/06/16   Arlean Hopping C, PA-C  metFORMIN (GLUCOPHAGE) 500 MG tablet Take 500  mg by mouth 2 (two) times daily with a meal.    [provider]  omeprazole (PRILOSEC) 40 MG capsule Take 40 mg by mouth daily. 05/16/16   [provider]  sertraline (ZOLOFT) 25 MG tablet Take 1 tablet (25 mg total) by mouth daily. 09/18/18   Rankin, Shuvon B, NP  SUMAtriptan (IMITREX) 50 MG tablet Take 50 mg by mouth daily as needed for migraine. 06/06/16   [provider]  topiramate (TOPAMAX) 50 MG tablet Take 50 mg by mouth 2 (two) times daily. Reported on 09/14/2015    [provider]  traZODone (DESYREL) 150 MG tablet Take 150 mg by mouth at bedtime. 04/05/16   [provider]  medroxyPROGESTERone (DEPO-PROVERA) 150 MG/ML injection Inject 150 mg into the muscle once. 04/17/11 06/06/11  [provider]    Allergies    Patient has no known allergies.  Review of Systems   Review of Systems  Constitutional: Negative for chills  and fever.  HENT: Negative for congestion and rhinorrhea.   Respiratory: Negative for cough and shortness of breath.   Cardiovascular: Negative for chest pain and palpitations.  Gastrointestinal: Negative for abdominal pain, diarrhea, nausea and vomiting.  Genitourinary: Negative for difficulty urinating and dysuria.  Musculoskeletal: Negative for arthralgias and back pain.  Skin: Negative for rash and wound.  Neurological: Negative for light-headedness and headaches.  Psychiatric/Behavioral: Positive for behavioral problems. Negative for suicidal ideas. The patient is nervous/anxious.     Physical Exam Updated Vital Signs BP (!) 158/78   Pulse 78   Temp 98 F (36.7 C) (Oral)   Resp 20   Ht 5\' 2"  (1.575 m)   Wt 100 kg   LMP 07/29/2012   SpO2 100%   BMI 40.32 kg/m   Physical Exam Vitals and nursing note reviewed. Exam conducted with a chaperone present.  Constitutional:      General: She is not in acute distress.    Appearance: Normal appearance.  HENT:     Head: Normocephalic and atraumatic.     Nose: No rhinorrhea.  Eyes:     General:        Right eye: No discharge.        Left eye: No discharge.     Conjunctiva/sclera: Conjunctivae normal.  Cardiovascular:     Rate and Rhythm: Normal rate and regular rhythm.  Pulmonary:     Effort: Pulmonary effort is normal. No respiratory distress.     Breath sounds: No stridor.  Abdominal:     General: Abdomen is flat. There is no distension.     Palpations: Abdomen is soft.  Musculoskeletal:        General: No tenderness or signs of injury.  Skin:    General: Skin is warm and dry.  Neurological:     General: No focal deficit present.     Mental Status: She is alert. Mental status is at baseline.     Motor: No weakness or seizure activity.  Psychiatric:        Mood and Affect: Mood normal.        Behavior: Behavior normal.        Thought Content: Thought content is paranoid. Thought content is not delusional. Thought  content does not include homicidal or suicidal ideation. Thought content does not include homicidal or suicidal plan.     ED Results / Procedures / Treatments   Labs (all labs ordered are listed, but only abnormal results are displayed) Labs Reviewed  BASIC METABOLIC  PANEL - Abnormal; Notable for the following components:      Result Value   Glucose, Bld 111 (*)    BUN 23 (*)    Creatinine, Ser 1.06 (*)    Calcium 8.5 (*)    All other components within normal limits  CK    EKG None  Radiology No results found.  Procedures Procedures (including critical care time)  Medications Ordered in ED Medications  LORazepam (ATIVAN) tablet 2 mg (2 mg Oral Given 03/13/20 0712)    ED Course  I have reviewed the triage vital signs and the nursing notes.  Pertinent labs & imaging results that were available during my care of the patient were reviewed by me and considered in my medical decision making (see chart for details).    MDM Rules/Calculators/A&P                          Patient reports she thought she was smoking crack cocaine as she does done in the past but it was different, she is intermittent spasm and jitteriness of her body, does appear to have ingestion is something sympathomimetic possibly methamphetamine.  She has some mild paranoia but no suicidal homicidal ideation.  Vital signs are stable we will get some screening labs give her Ativan and likely discharge her with recommendation for outpatient treatment for substance abuse issues.  Review of her lab studies show normal kidney function electrolytes within acceptable range, no elevation in CK, EKG reviewed by myself shows sinus rhythm without acute ischemic change interval abnormality or arrhythmia.  Likely substance abuse, possible methamphetamine versus cocaine.  She is feeling much better now she is less spastic and agitated, her heart rate is better, she feels comfortable with discharge and outpatient management.   Final Clinical Impression(s) / ED Diagnoses Final diagnoses:  Substance abuse [facility])    Rx / DC Orders ED Discharge Orders    None       Breck Coons, MD 03/13/20 1014

## 2020-03-13 NOTE — ED Notes (Signed)
Patient unable to reach ride home, will try again.

## 2020-03-18 ENCOUNTER — Emergency Department (HOSPITAL_COMMUNITY)
Admission: EM | Admit: 2020-03-18 | Discharge: 2020-03-18 | Disposition: A | Discharge status: Home / Self Care | Payer: Medicare Other | Attending: Emergency Medicine | Admitting: Emergency Medicine

## 2020-03-18 ENCOUNTER — Emergency Department (HOSPITAL_COMMUNITY): Payer: Medicare Other

## 2020-03-18 ENCOUNTER — Encounter (HOSPITAL_COMMUNITY): Payer: Self-pay | Admitting: Emergency Medicine

## 2020-03-18 ENCOUNTER — Other Ambulatory Visit: Payer: Self-pay

## 2020-03-18 DIAGNOSIS — E119 Type 2 diabetes mellitus without complications: Secondary | ICD-10-CM | POA: Diagnosis not present

## 2020-03-18 DIAGNOSIS — M25562 Pain in left knee: Secondary | ICD-10-CM | POA: Insufficient documentation

## 2020-03-18 DIAGNOSIS — Z7984 Long term (current) use of oral hypoglycemic drugs: Secondary | ICD-10-CM | POA: Diagnosis not present

## 2020-03-18 DIAGNOSIS — J45909 Unspecified asthma, uncomplicated: Secondary | ICD-10-CM | POA: Diagnosis not present

## 2020-03-18 MED ORDER — ACETAMINOPHEN 500 MG PO TABS
1000.0000 mg | ORAL_TABLET | Freq: Once | ORAL | Status: AC
  Administered 2020-03-18: 1000 mg via ORAL
  Filled 2020-03-18: qty 2

## 2020-03-18 MED ORDER — LIDOCAINE 5 % EX PTCH: 1.0000 | MEDICATED_PATCH | CUTANEOUS | 0 refills | Status: AC

## 2020-03-18 MED ORDER — KETOROLAC TROMETHAMINE 60 MG/2ML IM SOLN
60.0000 mg | Freq: Once | INTRAMUSCULAR | Status: AC
  Administered 2020-03-18: 60 mg via INTRAMUSCULAR
  Filled 2020-03-18: qty 2

## 2020-03-18 NOTE — Discharge Instructions (Addendum)
Thank you for choosing [facility] for your care today.  To do: You can control pain by alternating Tylenol and Motrin/Advil every 3 hours for the next few days. Icing the joint can help as well. Please follow up with your primary care doctor in the next few days. If you do not have a PCP you are established with, you can call [phone]  or [phone]  to access [facility] service. You can also visit [email or web address] Please come back to the Emergency Department if you have shortness of breath, chest pain, confusion/mental status changes, if you have so much nausea/vomiting that you cannot keep down fluids, or if you have any other symptoms that worry you.  Take care. Hope you start feeling better soon.  Vanna Scotland, MD Emergency Medicine

## 2020-03-18 NOTE — ED Triage Notes (Signed)
Pt arrives to ED with c/o left knee pain. States it chronic pain and aggravated it by standing too long putting up christmas lights.

## 2020-03-18 NOTE — Progress Notes (Signed)
Orthopedic Tech Progress Note Patient Details:  Foye Haggart 1965-05-26 [id number]  Ortho Devices Type of Ortho Device: Crutches,Knee Sleeve Ortho Device/Splint Location: LLE Ortho Device/Splint Interventions: Application,Ordered   Post Interventions Patient Tolerated: Well Instructions Provided: Adjustment of device   Iann Rodier A Sharmarke Cicio 03/18/2020, 6:57 PM

## 2020-03-18 NOTE — ED Notes (Signed)
Pt is back from x-ray

## 2020-03-18 NOTE — ED Notes (Signed)
Patient ambulated independently to and from restroom.
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
## 2021-05-26 ENCOUNTER — Ambulatory Visit (HOSPITAL_COMMUNITY)
Admission: EM | Admit: 2021-05-26 | Discharge: 2021-05-26 | Disposition: A | Discharge status: Home / Self Care | Payer: Medicare Other | Attending: Psychiatry | Admitting: Psychiatry

## 2021-05-26 DIAGNOSIS — R451 Restlessness and agitation: Secondary | ICD-10-CM | POA: Diagnosis present

## 2021-05-26 DIAGNOSIS — F2089 Other schizophrenia: Secondary | ICD-10-CM | POA: Insufficient documentation

## 2021-05-26 DIAGNOSIS — I10 Essential (primary) hypertension: Secondary | ICD-10-CM | POA: Diagnosis not present

## 2021-05-26 DIAGNOSIS — Z91199 Patient's noncompliance with other medical treatment and regimen due to unspecified reason: Secondary | ICD-10-CM | POA: Diagnosis not present

## 2021-05-26 NOTE — ED Notes (Signed)
Patient received After Visit Summary with community resources for follow up care. Patient voiced understanding, all belongings from [facility] locker patient received. Patient denies SI, and HI at time of discharge, pain 0/10. Patient left in private vehicle with family. ?

## 2021-05-26 NOTE — Discharge Instructions (Signed)
Please contact one of the following facilities to start medication management and therapy services:  ? ?[facility] ?[address]  ?[place], [place] [address] ?[phone]  ? ?[facility]  ?[address] ?[place], [place] [address] ?[phone] ? ?[facility]  ?[address], [place], [place] [address] ?[phone] ? ?[facility]  ?[address]  ?[place], [place] [address] ?[phone] ? ?[facility]  ?[address] ?[place], [place] [address] ?[phone] ? ?[facility]  ?[address],  ?[place], [place] [address] ?[phone]  ?

## 2021-05-26 NOTE — ED Triage Notes (Signed)
Pt presents to [facility] seeking medication management. Pt states that she has been diagnosed with Bipolar and schizophrenia. Pt reports that she saw her PCP today at [facility] and they recommended that she come to this facility so that she can get set up with a psychiatrist to get back on her psychotropic medications. Pt states that she has been off of her medication for several years and it is starting to effect her because she is beginning to get upset more than usual. Pt reports being irratable lately, "people have just been making me mad, they just make you want to hurt them". Pt states " I just need to get on my meds". Pt is calm and cooperative during triage. Pt denies SI/HI and AVH. ?

## 2021-05-26 NOTE — ED Provider Notes (Signed)
Behavioral Health Urgent Care Medical Screening Exam ? ?Patient Name: Tiffany Blake ?MRN: [id number] ?Date of Evaluation: 05/26/21 ?Diagnosis:  ?Final diagnoses:  ?Agitation  ? ? ?History of Present illness: Tiffany Blake is a 56 y.o. female patient who presents to the [facility] behavioral health urgent care voluntarily as a walk-in accompanied by her friend Domingo Madeira with a chief complaint of requesting outpatient resources for psychiatry for medication management. ? ?Patient states that she was referred by her PCP today at [facility].  She states that she needs to get back on her medications because "people get on my nerves and I go off." She states that she gets easily agitated by people asking her questions or accusing her of doing stuff.  ? ?Patient reports a psychiatric history of bipolar schizophrenia. Patient states she has been off of her psychiatric medications since she was in her 54s. Patient is unable to recall the name of the medications that she was prescribed at that time. Patient states that she does not have a current outpatient psychiatrist or therapist. Patient denies past history of psychiatric hospitalization.  ? ?Patient denies suicidal and homicidal ideations. Patient denies auditory and visual hallucinations.  Patient denies depressive symptoms. Patient denies anxiety symptoms. Patient reports fair sleep and states that she sleeps about 5 hours on average because does not use her CPAP. Patient denies drinking alcohol or using illicit drugs. Patient states that she resides alone in her apartment. She identifies her friend Nadara Mustard as her support person.   ? ?I discussed with the patient following up here ([facility]) upstairs for medication management. Also, advised patient to call list of resources provided at discharge to see what facilities acceptsmedicare and medicaid.  ? ? ?Psychiatric Specialty Exam ? ?Presentation  ?General Appearance:Appropriate for Environment ? ?Eye  Contact:Fair ? ?Speech:Clear and Coherent ? ?Speech Volume:Normal ? ?Handedness:No data recorded ? ?Mood and Affect  ?Mood:Euthymic ? ?Affect:Congruent ? ? ?Thought Process  ?Thought Processes:Coherent; Goal Directed ? ?Descriptions of Associations:Intact ? ?Orientation:Full (Time, Place and Person) ? ?Thought Content:Logical ?   Hallucinations:None ? ?Ideas of Reference:None ? ?Suicidal Thoughts:No ? ?Homicidal Thoughts:No ? ? ?Sensorium  ?Memory:Immediate Fair; Remote Fair; Recent Fair ? ?Judgment:Fair ? ?Insight:Fair ? ? ?Executive Functions  ?Concentration:Fair ? ?Attention Span:Fair ? ?Recall:Fair ? ?[place] ? ?Language:Fair ? ? ?Psychomotor Activity  ?Psychomotor Activity:Normal ? ? ?Assets  ?Assets:Communication Skills; Desire for Improvement; Housing; Leisure Time; Physical Health; Social Support ? ? ?Sleep  ?Sleep:Fair ? ?Number of hours: 5 ? ? ?No data recorded ? ?Physical Exam: ?Physical Exam ?Constitutional:   ?   Appearance: Normal appearance.  ?HENT:  ?   Head: Normocephalic and atraumatic.  ?   Nose: Nose normal.  ?Eyes:  ?   Conjunctiva/sclera: Conjunctivae normal.  ?Cardiovascular:  ?   Rate and Rhythm: Normal rate.  ?   Comments: Hypertensive  ?Pulmonary:  ?   Effort: Pulmonary effort is normal.  ?Musculoskeletal:     ?   General: Normal range of motion.  ?   Cervical back: Normal range of motion.  ?Neurological:  ?   Mental Status: She is alert and oriented to person, place, and time.  ? ?Review of Systems  ?Constitutional: Negative.   ?HENT: Negative.    ?Eyes: Negative.   ?Respiratory: Negative.    ?Cardiovascular: Negative.   ?Gastrointestinal: Negative.   ?Genitourinary: Negative.   ?Musculoskeletal: Negative.   ?Skin: Negative.   ?Neurological: Negative.   ?Endo/Heme/Allergies: Negative.   ?Blood pressure (!) 154/94,  pulse 77, temperature 98.7 ?F (37.1 ?C), resp. rate 18, last menstrual period 07/29/2012, SpO2 98 %. There is no height or weight on file to calculate  BMI. ? ?Musculoskeletal: ?Strength & Muscle Tone: within normal limits ?Gait & Station: normal ?Patient leans: N/A ? ? ?[facility] Discharge Disposition for Follow up and Recommendations: ?Based on my evaluation the patient does not appear to have an emergency medical condition and can be discharged with resources and follow up care in outpatient services for Medication Management, Individual Therapy, and Group Therapy ? ? ?Marissa Calamity, NP ?05/26/2021, 4:58 PM ? ?

## 2021-06-01 ENCOUNTER — Other Ambulatory Visit: Payer: Self-pay | Admitting: Student

## 2021-06-01 ENCOUNTER — Ambulatory Visit
Admission: RE | Admit: 2021-06-01 | Discharge: 2021-06-01 | Disposition: A | Discharge status: Home / Self Care | Payer: Medicare Other | Source: Ambulatory Visit | Attending: Student | Admitting: Student

## 2021-06-01 DIAGNOSIS — M25561 Pain in right knee: Secondary | ICD-10-CM

## 2021-06-12 ENCOUNTER — Telehealth (HOSPITAL_COMMUNITY): Payer: Self-pay | Admitting: Family

## 2021-06-12 NOTE — BH Assessment (Signed)
Care Management - [facility] Follow Up Discharges  ° °Writer attempted to make contact with patient today and was unsuccessful.  Writer left a HIPPA compliant voice message.  ° °Per chart review, patient was provided with outpatient resources. ° °

## 2021-10-24 DEATH — deceased
